# Patient Record
Sex: Male | Born: 1935 | Race: White | Hispanic: No | Marital: Married | Smoking: Former smoker
Health system: Southern US, Community
[De-identification: ages and names within clinical notes are randomized; demographics above are authoritative.]

## PROBLEM LIST (undated history)

## (undated) DIAGNOSIS — K219 Gastro-esophageal reflux disease without esophagitis: Secondary | ICD-10-CM

## (undated) DIAGNOSIS — K5792 Diverticulitis of intestine, part unspecified, without perforation or abscess without bleeding: Secondary | ICD-10-CM

## (undated) DIAGNOSIS — E785 Hyperlipidemia, unspecified: Secondary | ICD-10-CM

## (undated) DIAGNOSIS — J302 Other seasonal allergic rhinitis: Secondary | ICD-10-CM

## (undated) DIAGNOSIS — G5600 Carpal tunnel syndrome, unspecified upper limb: Secondary | ICD-10-CM

## (undated) DIAGNOSIS — J45909 Unspecified asthma, uncomplicated: Secondary | ICD-10-CM

## (undated) DIAGNOSIS — H9190 Unspecified hearing loss, unspecified ear: Secondary | ICD-10-CM

## (undated) DIAGNOSIS — I10 Essential (primary) hypertension: Secondary | ICD-10-CM

## (undated) HISTORY — PX: NOSE SURGERY: SHX723

## (undated) HISTORY — PX: CHOLECYSTECTOMY: SHX55

## (undated) HISTORY — PX: CARPAL TUNNEL RELEASE: SHX101

## (undated) HISTORY — PX: SHOULDER SURGERY: SHX246

---

## 2004-04-05 ENCOUNTER — Ambulatory Visit: Payer: Self-pay | Admitting: General Practice

## 2004-04-05 ENCOUNTER — Other Ambulatory Visit: Payer: Self-pay

## 2004-08-22 ENCOUNTER — Ambulatory Visit: Payer: Self-pay | Admitting: General Practice

## 2006-02-26 ENCOUNTER — Ambulatory Visit: Payer: Self-pay | Admitting: Ophthalmology

## 2007-11-05 ENCOUNTER — Emergency Department: Payer: Self-pay | Admitting: Emergency Medicine

## 2008-03-16 ENCOUNTER — Ambulatory Visit: Payer: Self-pay | Admitting: Unknown Physician Specialty

## 2009-11-19 ENCOUNTER — Ambulatory Visit: Payer: Self-pay | Admitting: Internal Medicine

## 2010-06-24 DIAGNOSIS — A0472 Enterocolitis due to Clostridium difficile, not specified as recurrent: Secondary | ICD-10-CM

## 2010-06-24 HISTORY — DX: Enterocolitis due to Clostridium difficile, not specified as recurrent: A04.72

## 2010-09-23 ENCOUNTER — Ambulatory Visit: Payer: Self-pay | Admitting: Internal Medicine

## 2010-09-24 ENCOUNTER — Ambulatory Visit: Payer: Self-pay | Admitting: Internal Medicine

## 2010-10-12 ENCOUNTER — Observation Stay: Payer: Self-pay | Admitting: Internal Medicine

## 2010-10-23 ENCOUNTER — Ambulatory Visit: Payer: Self-pay | Admitting: Internal Medicine

## 2011-01-23 ENCOUNTER — Ambulatory Visit: Payer: Self-pay | Admitting: Family Medicine

## 2011-01-26 ENCOUNTER — Inpatient Hospital Stay: Payer: Self-pay | Admitting: Student

## 2011-02-06 ENCOUNTER — Ambulatory Visit: Payer: Self-pay | Admitting: Family Medicine

## 2011-03-04 ENCOUNTER — Ambulatory Visit: Payer: Self-pay | Admitting: Family Medicine

## 2011-04-09 ENCOUNTER — Other Ambulatory Visit: Payer: Self-pay | Admitting: Gastroenterology

## 2011-04-30 ENCOUNTER — Other Ambulatory Visit: Payer: Self-pay | Admitting: Gastroenterology

## 2011-05-29 ENCOUNTER — Other Ambulatory Visit: Payer: Self-pay | Admitting: Gastroenterology

## 2011-05-30 ENCOUNTER — Ambulatory Visit: Payer: Self-pay | Admitting: Surgery

## 2011-06-13 ENCOUNTER — Other Ambulatory Visit: Payer: Self-pay | Admitting: Gastroenterology

## 2011-07-03 ENCOUNTER — Ambulatory Visit: Payer: Self-pay | Admitting: Surgery

## 2011-07-04 LAB — HEMOGLOBIN: HGB: 12 g/dL — ABNORMAL LOW (ref 13.0–18.0)

## 2011-07-05 LAB — PATHOLOGY REPORT

## 2011-07-10 ENCOUNTER — Other Ambulatory Visit: Payer: Self-pay | Admitting: Gastroenterology

## 2011-07-12 LAB — CLOSTRIDIUM DIFFICILE BY PCR

## 2013-05-19 ENCOUNTER — Ambulatory Visit: Payer: Self-pay | Admitting: Gastroenterology

## 2013-07-31 ENCOUNTER — Ambulatory Visit: Payer: Self-pay | Admitting: Family Medicine

## 2013-08-04 LAB — WOUND CULTURE

## 2013-12-23 DIAGNOSIS — K219 Gastro-esophageal reflux disease without esophagitis: Secondary | ICD-10-CM | POA: Diagnosis present

## 2014-10-16 NOTE — Op Note (Signed)
PATIENT NAME:  Colin, Decker MR#:  [id number] DATE OF BIRTH:  [birth year]  DATE OF PROCEDURE:  07/03/2011  PREOPERATIVE DIAGNOSIS: Chronic cholecystitis, cholelithiasis, with history of biliary pancreatitis.   POSTOPERATIVE DIAGNOSIS: Chronic cholecystitis, cholelithiasis, with history of biliary pancreatitis.   PROCEDURE: Laparoscopic cholecystectomy, cholangiogram.   SURGEON: Rochel Brome, MD   ASSISTANT: Britta Mccreedy, PA   ANESTHESIA: General.   INDICATIONS: This 81 year old male has a history of abdominal pain, findings of pancreatitis, ultrasound findings of sludge and a gallbladder ejection fraction of 18%. It appeared that he had biliary pancreatitis, and laparoscopic cholecystectomy was recommended to help avoid future cases of pain and pancreatitis.   DESCRIPTION OF PROCEDURE: The patient was placed on the operating table in the supine position under general endotracheal anesthesia. The abdomen was prepared with ChloraPrep and draped in a sterile manner.   A short incision was made in the inferior aspect of the umbilicus and carried down to the deep fascia, which was grasped with a laryngeal hook and elevated. A Veress needle was inserted and aspirated and irrigated with a saline solution. Next, the peritoneal cavity was inflated with carbon dioxide. The Veress needle was removed. The 10 mm cannula was inserted. The 10 mm, 0 degree laparoscope was inserted to view the peritoneal cavity. The liver appeared to be significantly  above the costal margin and was covered with omentum. Another incision was made in the epigastrium slightly to the right of the midline to introduce another 11 mm cannula. Two incisions were made in the lateral aspect of the right upper quadrant to introduce two 5 mm cannulas.   Graspers and microdissector were used to maneuver the omentum down off the liver. I then could see a portion of the gallbladder. The omentum was retracted. I switched to the 25 degree  laparoscope, and there was a large amount of fatty tissue and bowel which obscured the view of the gallbladder. The gallbladder was retracted towards the right shoulder. It was elected to make a fifth incision which is just slightly to the left of the midline in the epigastrium to introduce another 11 mm cannula. I brought the skull to this port and then inserted a five-finger fan retractor through the umbilical port and advanced this up to retract the omentum and transverse colon for better exposure of the gallbladder. The porta hepatis was demonstrated. The gallbladder had a slightly thickened wall. The infundibulum was retracted inferiorly and laterally. A number of adhesions were taken down with blunt and sharp dissection. Next, dissection was carried out to isolate the cystic artery from surrounding tissues; and this appeared to be overlying the site of the cystic duct, and the cystic artery was positively identified and was therefore divided by applying and two EndoClips proximally, one distally, and dividing the cystic artery with the scissors. Next, further dissection was carried out mobilizing the neck of the gallbladder and exposing the cystic duct, which was dissected free from surrounding structures. Its junction with the gallbladder was demonstrated and with the porta hepatis demonstrated. Next, an EndoClip was placed across the cystic duct adjacent to the neck of the gallbladder. An incision was made in the cystic duct, and I inserted a Reddick catheter. Half-strength Conray-60 dye was injected as the cholangiogram was done with fluoroscopy demonstrating the biliary tree and flow of dye into the duodenum. Bile ducts were small in size. No retained stones were seen. The Reddick catheter was removed. The cystic duct was doubly ligated with EndoClips and divided. Next,  the gallbladder was dissected free from the liver. It is noted that approximately two thirds of the way through this dissection there was  some bleeding from the gallbladder bed which was somewhat brisk, and this was aspirated; and it was about this time that we had to change carbon dioxide canisters, and there was some bleeding during the changing of the canisters. I aspirated additional blood and some clots and held pressure on this site with the stone scoop, and subsequently I was able to suction enough and see where several bleeding points could be cauterized. Next, the gallbladder was completely separated from the liver with additional use of hook and cautery, and was delivered out through the umbilical port site as it was opened and suctioned and removed, and I sent it with palpable stone for routine pathology. The 10 mm cannula was reinserted into the umbilical incision. The gallbladder bed was further inspected. There was some clot by this time which was aspirated, and also I aspirated some bloody fluid lateral to the liver. The gallbladder fossa was further irrigated and aspirated. Several additional small bleeding points were cauterized, and subsequently it appeared the hemostasis was intact. The gallbladder bed was subsequently treated with Surgiflo with thrombin. Following this, I did not identify any further bleeding. Next, a 19-French Blake drain was inserted and brought out through the lowermost 5 mm cannula site and was placed along the peritoneal reflection laterally and placed just to the right of the liver. This was secured to the skin with 3-0 nylon suture. Next, the cannulas were removed allowing carbon dioxide to escape from the peritoneal cavity. Skin incisions were closed with interrupted 5-0 chromic subcuticular sutures, benzoin, and Steri-Strips. Dressings were applied with paper tape. The drain was activated. There was just scant serosanguineous drainage. The patient appeared to be in satisfactory condition. Estimated blood loss was approximately 200 mL. The patient was then prepared for transfer to the recovery room.     ____________________________ Lenna Sciara. Rochel Brome, MD [id number] D: 07/03/2011 13:52:49 ET T: 07/03/2011 16:37:53 ET JOB#: [id number]  cc: Loreli Dollar, MD, <Dictator> Loreli Dollar MD ELECTRONICALLY SIGNED 07/28/2011 15:04

## 2015-10-31 ENCOUNTER — Ambulatory Visit
Admission: EM | Admit: 2015-10-31 | Discharge: 2015-10-31 | Disposition: A | Discharge status: Home / Self Care | Payer: Medicare Other | Attending: Family Medicine | Admitting: Family Medicine

## 2015-10-31 DIAGNOSIS — J069 Acute upper respiratory infection, unspecified: Secondary | ICD-10-CM | POA: Diagnosis not present

## 2015-10-31 HISTORY — DX: Essential (primary) hypertension: I10

## 2015-10-31 MED ORDER — HYDROCOD POLST-CPM POLST ER 10-8 MG/5ML PO SUER: 5.0000 mL | Freq: Two times a day (BID) | ORAL | Status: DC

## 2015-10-31 MED ORDER — AZITHROMYCIN 250 MG PO TABS: ORAL_TABLET | ORAL | Status: DC

## 2015-10-31 NOTE — Discharge Instructions (Signed)
Cool Mist Vaporizers °Vaporizers may help relieve the symptoms of a cough and cold. They add moisture to the air, which helps mucus to become thinner and less sticky. This makes it easier to breathe and cough up secretions. Cool mist vaporizers do not cause serious burns like hot mist vaporizers, which may also be called steamers or humidifiers. Vaporizers have not been proven to help with colds. You should not use a vaporizer if you are allergic to mold. °HOME CARE INSTRUCTIONS °· Follow the package instructions for the vaporizer. °· Do not use anything other than distilled water in the vaporizer. °· Do not run the vaporizer all of the time. This can cause mold or bacteria to grow in the vaporizer. °· Clean the vaporizer after each time it is used. °· Clean and dry the vaporizer well before storing it. °· Stop using the vaporizer if worsening respiratory symptoms develop. °  °This information is not intended to replace advice given to you by your health care provider. Make sure you discuss any questions you have with your health care provider. °  °Document Released: 03/07/2004 Document Revised: 06/15/2013 Document Reviewed: 10/28/2012 °Elsevier Interactive Patient Education ©2016 [employer] ° °Upper Respiratory Infection, Adult °Most upper respiratory infections (URIs) are a viral infection of the air passages leading to the lungs. A URI affects the nose, throat, and upper air passages. The most common type of URI is nasopharyngitis and is typically referred to as "the common cold." °URIs run their course and usually go away on their own. Most of the time, a URI does not require medical attention, but sometimes a bacterial infection in the upper airways can follow a viral infection. This is called a secondary infection. Sinus and middle ear infections are common types of secondary upper respiratory infections. °Bacterial pneumonia can also complicate a URI. A URI can worsen asthma and chronic obstructive  pulmonary disease (COPD). Sometimes, these complications can require emergency medical care and may be life threatening.  °CAUSES °Almost all URIs are caused by viruses. A virus is a type of germ and can spread from one person to another.  °RISKS FACTORS °You may be at risk for a URI if:  °· You smoke.   °· You have chronic heart or lung disease. °· You have a weakened defense (immune) system.   °· You are very young or very old.   °· You have nasal allergies or asthma. °· You work in crowded or poorly ventilated areas. °· You work in health care facilities or schools. °SIGNS AND SYMPTOMS  °Symptoms typically develop 2-3 days after you come in contact with a cold virus. Most viral URIs last 7-10 days. However, viral URIs from the influenza virus (flu virus) can last 14-18 days and are typically more severe. Symptoms may include:  °· Runny or stuffy (congested) nose.   °· Sneezing.   °· Cough.   °· Sore throat.   °· Headache.   °· Fatigue.   °· Fever.   °· Loss of appetite.   °· Pain in your forehead, behind your eyes, and over your cheekbones (sinus pain). °· Muscle aches.   °DIAGNOSIS  °Your health care provider may diagnose a URI by: °· Physical exam. °· Tests to check that your symptoms are not due to another condition such as: °¨ Strep throat. °¨ Sinusitis. °¨ Pneumonia. °¨ Asthma. °TREATMENT  °A URI goes away on its own with time. It cannot be cured with medicines, but medicines may be prescribed or recommended to relieve symptoms. Medicines may help: °· Reduce your fever. °· Reduce   your cough. °· Relieve nasal congestion. °HOME CARE INSTRUCTIONS  °· Take medicines only as directed by your health care provider.   °· Gargle warm saltwater or take cough drops to comfort your throat as directed by your health care provider. °· Use a warm mist humidifier or inhale steam from a shower to increase air moisture. This may make it easier to breathe. °· Drink enough fluid to keep your urine clear or pale yellow.   °· Eat  soups and other clear broths and maintain good nutrition.   °· Rest as needed.   °· Return to work when your temperature has returned to normal or as your health care provider advises. You may need to stay home longer to avoid infecting others. You can also use a face mask and careful hand washing to prevent spread of the virus. °· Increase the usage of your inhaler if you have asthma.   °· Do not use any tobacco products, including cigarettes, chewing tobacco, or electronic cigarettes. If you need help quitting, ask your health care provider. °PREVENTION  °The best way to protect yourself from getting a cold is to practice good hygiene.  °· Avoid oral or hand contact with people with cold symptoms.   °· Wash your hands often if contact occurs.   °There is no clear evidence that vitamin C, vitamin E, echinacea, or exercise reduces the chance of developing a cold. However, it is always recommended to get plenty of rest, exercise, and practice good nutrition.  °SEEK MEDICAL CARE IF:  °· You are getting worse rather than better.   °· Your symptoms are not controlled by medicine.   °· You have chills. °· You have worsening shortness of breath. °· You have brown or red mucus. °· You have yellow or brown nasal discharge. °· You have pain in your face, especially when you bend forward. °· You have a fever. °· You have swollen neck glands. °· You have pain while swallowing. °· You have white areas in the back of your throat. °SEEK IMMEDIATE MEDICAL CARE IF:  °· You have severe or persistent: °¨ Headache. °¨ Ear pain. °¨ Sinus pain. °¨ Chest pain. °· You have chronic lung disease and any of the following: °¨ Wheezing. °¨ Prolonged cough. °¨ Coughing up blood. °¨ A change in your usual mucus. °· You have a stiff neck. °· You have changes in your: °¨ Vision. °¨ Hearing. °¨ Thinking. °¨ Mood. °MAKE SURE YOU:  °· Understand these instructions. °· Will watch your condition. °· Will get help right away if you are not doing well or  get worse. °  °This information is not intended to replace advice given to you by your health care provider. Make sure you discuss any questions you have with your health care provider. °  °Document Released: 12/04/2000 Document Revised: 10/25/2014 Document Reviewed: 09/15/2013 °Elsevier Interactive Patient Education ©2016 Elsevier Inc. ° °

## 2015-10-31 NOTE — ED Provider Notes (Signed)
CSN: [id number]     Arrival date & time 10/31/15  1121 History   First MD Initiated Contact with Patient 10/31/15 1227     Chief Complaint  Patient presents with  . URI    Reports cough of yellow phlegm and "URI" sx starting on Saturday. Wife was seen here on Friday for similar sx and given Z pack.    (Consider location/radiation/quality/duration/timing/severity/associated sxs/prior Treatment) HPI  This is a 82 year old male who presents with cough productive of yellow sputum which started on Saturday. His wife was here on Friday for the same symptoms and was prescribed a Z-Pak which has almost totally resolved her problems. He did try half teaspoon of the cough syrup last night which lasted about 2 hours and then he started coughing more. Vital signs show temperature 98.3, pulse 74, respirations 18, blood pressure 132/67, and O2 sats 100% on room air    Past Medical History  Diagnosis Date  . Hypertension    History reviewed. No pertinent past surgical history. History reviewed. No pertinent family history. Social History  Substance Use Topics  . Smoking status: Former Research scientist (life sciences)  . Smokeless tobacco: None  . Alcohol Use: No    Review of Systems  Constitutional: Positive for activity change. Negative for fever, chills and fatigue.  HENT: Positive for congestion and postnasal drip.   Respiratory: Positive for cough. Negative for shortness of breath.   All other systems reviewed and are negative.   Allergies  Biaxin; Codeine sulfate; Erythromycin; Naprosyn; and Augmentin  Home Medications   Prior to Admission medications   Medication Sig Start Date End Date Taking? Authorizing Provider  amLODipine (NORVASC) 2.5 MG tablet Take 2.5 mg by mouth daily.   Yes Historical Provider, MD  aspirin 81 MG tablet Take 81 mg by mouth daily.   Yes Historical Provider, MD  calcium carbonate (OS-CAL - DOSED IN MG OF ELEMENTAL CALCIUM) 1250 (500 Ca) MG tablet Take 0.5 tablets by mouth.   Yes  Historical Provider, MD  RABEprazole (ACIPHEX) 20 MG tablet Take 20 mg by mouth daily.   Yes Historical Provider, MD  azithromycin (ZITHROMAX Z-PAK) 250 MG tablet Use as per package instructions 10/31/15   Lorin Picket, PA-C  chlorpheniramine-HYDROcodone [facility] ER) 10-8 MG/5ML SUER Take 5 mLs by mouth 2 (two) times daily. 10/31/15   Lorin Picket, PA-C  lisinopril (PRINIVIL,ZESTRIL) 20 MG tablet Take 20 mg by mouth daily.    Historical Provider, MD   Meds Ordered and Administered this Visit  Medications - No data to display  BP 132/67 mmHg  Pulse 74  Temp(Src) 98.3 F (36.8 C)  Resp 18  Ht 5\' 1"  (1.549 m)  Wt 129 lb (58.514 kg)  BMI 24.39 kg/m2  SpO2 100% No data found.   Physical Exam  Constitutional: He is oriented to person, place, and time. He appears well-developed and well-nourished. No distress.  HENT:  Head: Normocephalic and atraumatic.  Right Ear: External ear normal.  Left Ear: External ear normal.  Nose: Nose normal.  Mouth/Throat: Oropharynx is clear and moist. No oropharyngeal exudate.  Patient wears bilateral hearing aids  Eyes: Conjunctivae are normal. Pupils are equal, round, and reactive to light. Right eye exhibits no discharge. Left eye exhibits no discharge.  Neck: Normal range of motion. Neck supple.  Pulmonary/Chest: Effort normal and breath sounds normal. No respiratory distress. He has no wheezes. He has no rales.  Musculoskeletal: Normal range of motion. He exhibits no edema or tenderness.  Lymphadenopathy:  He has no cervical adenopathy.  Neurological: He is alert and oriented to person, place, and time.  Skin: Skin is warm and dry. He is not diaphoretic.  Psychiatric: He has a normal mood and affect. His behavior is normal. Judgment and thought content normal.  Nursing note and vitals reviewed.   ED Course  Procedures (including critical care time)  Labs Review Labs Reviewed - No data to display  Imaging Review No  results found.   Visual Acuity Review  Right Eye Distance:   Left Eye Distance:   Bilateral Distance:    Right Eye Near:   Left Eye Near:    Bilateral Near:         MDM   1. Acute URI    Discharge Medication List as of 10/31/2015 12:49 PM    START taking these medications   Details  azithromycin (ZITHROMAX Z-PAK) 250 MG tablet Use as per package instructions, Normal    chlorpheniramine-HYDROcodone (TUSSIONEX PENNKINETIC ER) 10-8 MG/5ML SUER Take 5 mLs by mouth 2 (two) times daily., Starting 10/31/2015, Until Discontinued, Print      Plan: 1. Test/x-ray results and diagnosis reviewed with patient 2. rx as per orders; risks, benefits, potential side effects reviewed with patient 3. Recommend supportive treatment withFluids and rest. It is not improving within several days he should be seen by his primary care physician possible chest x-ray.She does have a listed allergy to erythromycin and clarithromycin but is listed only as nausea. I have asked him to take the medication with food to see if this will help. Is allergic to several antibiotics and I feel that this will provide the best coverage for him. 4. F/u prn if symptoms worsen or don't improve    Lorin Picket, PA-C 10/31/15 1309

## 2015-12-18 DIAGNOSIS — N1831 Chronic kidney disease, stage 3a: Secondary | ICD-10-CM | POA: Diagnosis present

## 2017-04-21 ENCOUNTER — Ambulatory Visit
Admission: EM | Admit: 2017-04-21 | Discharge: 2017-04-21 | Disposition: A | Discharge status: Home / Self Care | Payer: Medicare Other | Attending: Family Medicine | Admitting: Family Medicine

## 2017-04-21 ENCOUNTER — Encounter: Payer: Self-pay | Admitting: *Deleted

## 2017-04-21 DIAGNOSIS — H9202 Otalgia, left ear: Secondary | ICD-10-CM | POA: Diagnosis not present

## 2017-04-21 DIAGNOSIS — L03818 Cellulitis of other sites: Secondary | ICD-10-CM

## 2017-04-21 DIAGNOSIS — H6010 Cellulitis of external ear, unspecified ear: Secondary | ICD-10-CM

## 2017-04-21 HISTORY — DX: Unspecified asthma, uncomplicated: J45.909

## 2017-04-21 HISTORY — DX: Hyperlipidemia, unspecified: E78.5

## 2017-04-21 HISTORY — DX: Gastro-esophageal reflux disease without esophagitis: K21.9

## 2017-04-21 MED ORDER — DOXYCYCLINE HYCLATE 100 MG PO CAPS: 100.0000 mg | ORAL_CAPSULE | Freq: Two times a day (BID) | ORAL | 0 refills | Status: DC

## 2017-04-21 NOTE — ED Triage Notes (Signed)
Patient started having symptom of swollen left ear yesterday. The ear is visibly swollen. Patient had a left ear infection 40 years ago and was hospitalized.

## 2017-04-21 NOTE — Discharge Instructions (Signed)
Antibiotic as prescribed.  Do not take it with your other medications.  Take care  Dr. Lacinda Axon

## 2017-04-21 NOTE — ED Provider Notes (Signed)
[facility]    CSN: [id number] Arrival date & time: 04/21/17  1504  History   Chief Complaint Chief Complaint  Patient presents with  . Facial Swelling   HPI  83 year old male presents with swelling and redness of the left ear.  Started last night.  No associated fever.  He reports some associated pain as well as redness and swelling of the left ear.  No known inciting factor other than potentially scratch.  He states that his ear itches often.  He feels that this is due to his hearing aids.  No known exacerbating or relieving factors.  No medications or interventions tried.  He does note that 40 years ago he had a case of erysipelas which presented similarly.  No other complaints or concerns at this time.  Past Medical History:  Diagnosis Date  . Asthma   . GERD (gastroesophageal reflux disease)   . Hyperlipidemia   . Hypertension    Past Surgical History:  Procedure Laterality Date  . SHOULDER SURGERY Left     Home Medications    Prior to Admission medications   Medication Sig Start Date End Date Taking? Authorizing Provider  amLODipine (NORVASC) 2.5 MG tablet Take 2.5 mg by mouth daily.   Yes [provider]  aspirin 81 MG tablet Take 81 mg by mouth daily.   Yes [provider]  calcium carbonate (OS-CAL - DOSED IN MG OF ELEMENTAL CALCIUM) 1250 (500 Ca) MG tablet Take 0.5 tablets by mouth.   Yes [provider]  chlorthalidone (HYGROTON) 25 MG tablet Take 25 mg by mouth daily.   Yes [provider]  febuxostat (ULORIC) 40 MG tablet Take 40 mg by mouth daily.   Yes [provider]  lisinopril (PRINIVIL,ZESTRIL) 20 MG tablet Take 20 mg by mouth daily.   Yes [provider]  RABEprazole (ACIPHEX) 20 MG tablet Take 20 mg by mouth daily.   Yes [provider]  azithromycin (ZITHROMAX Z-PAK) 250 MG tablet Use as per package instructions 10/31/15   Lorin Picket, PA-C  chlorpheniramine-HYDROcodone  [facility] ER) 10-8 MG/5ML SUER Take 5 mLs by mouth 2 (two) times daily. 10/31/15   Lorin Picket, PA-C  doxycycline (VIBRAMYCIN) 100 MG capsule Take 1 capsule (100 mg total) by mouth 2 (two) times daily. 04/21/17   Jasilyn Holderman, Barnie Del, DO  Ipratropium-Albuterol (COMBIVENT RESPIMAT) 20-100 MCG/ACT AERS respimat Inhale 1 puff into the lungs every 6 (six) hours.    [provider]   Family History COPD Father    Rheum arthritis Mother    Allergies Other  Sibling  Myocardial Infarction (Heart attack) Other  Sibling  Heart disease Sister     Social History Social History  Substance Use Topics  . Smoking status: Former Research scientist (life sciences)  . Smokeless tobacco: Never Used  . Alcohol use No   Allergies   Biaxin [clarithromycin]; Codeine sulfate; Erythromycin; Naprosyn [naproxen]; and Augmentin [amoxicillin-pot clavulanate]  Review of Systems Review of Systems  Constitutional: Negative for fever.  HENT:       Left ear pain, swelling, redness.   Physical Exam Triage Vital Signs ED Triage Vitals  Enc Vitals Group     BP 04/21/17 1537 (!) 136/57     Pulse Rate 04/21/17 1537 63     Resp 04/21/17 1537 16     Temp 04/21/17 1537 98 F (36.7 C)     Temp Source 04/21/17 1537 Oral     SpO2 04/21/17 1537 100 %  Weight 04/21/17 1539 130 lb (59 kg)     Height 04/21/17 1539 5\' 1"  (1.549 m)     Head Circumference --      Peak Flow --      Pain Score 04/21/17 1540 0     Pain Loc --      Pain Edu? --      Excl. in [place]? --    Updated Vital Signs BP (!) 136/57 (BP Location: Left Arm)   Pulse 63   Temp 98 F (36.7 C) (Oral)   Resp 16   Ht 5\' 1"  (1.549 m)   Wt 130 lb (59 kg)   SpO2 100%   BMI 24.56 kg/m   Physical Exam  Constitutional: He is oriented to person, place, and time. He appears well-developed. No distress.  HENT:  Head: Normocephalic and atraumatic.  Left ear -external ear is red, swollen, and warm to the touch.  Eyes: Conjunctivae are normal. No scleral  icterus.  Cardiovascular: Normal rate and regular rhythm.   Pulmonary/Chest: Effort normal and breath sounds normal. He has no wheezes. He has no rales.  Neurological: He is alert and oriented to person, place, and time.  Skin:  Left ear erythema, swelling.   Psychiatric: He has a normal mood and affect.  Vitals reviewed.  UC Treatments / Results  Labs (all labs ordered are listed, but only abnormal results are displayed) Labs Reviewed - No data to display  EKG  EKG Interpretation None       Radiology No results found.  Procedures Procedures (including critical care time)  Medications Ordered in UC Medications - No data to display   Initial Impression / Assessment and Plan / UC Course  I have reviewed the triage vital signs and the nursing notes.  Pertinent labs & imaging results that were available during my care of the patient were reviewed by me and considered in my medical decision making (see chart for details).    83 year old male presents with left ear swelling, erythema, warmth.  Consistent with cellulitis. Treating with Doxy.  Final Clinical Impressions(s) / UC Diagnoses   Final diagnoses:  Cellulitis of other specified site   New Prescriptions Discharge Medication List as of 04/21/2017  4:54 PM    START taking these medications   Details  doxycycline (VIBRAMYCIN) 100 MG capsule Take 1 capsule (100 mg total) by mouth 2 (two) times daily., Starting Mon 04/21/2017, Normal       Controlled Substance Prescriptions [place] Controlled Substance Registry consulted? Not Applicable   Coral Spikes, DO 04/21/17 1709

## 2018-12-21 ENCOUNTER — Ambulatory Visit
Admission: EM | Admit: 2018-12-21 | Discharge: 2018-12-21 | Disposition: A | Discharge status: Home / Self Care | Payer: Medicare Other

## 2018-12-21 ENCOUNTER — Other Ambulatory Visit: Payer: Self-pay

## 2018-12-21 DIAGNOSIS — H1032 Unspecified acute conjunctivitis, left eye: Secondary | ICD-10-CM | POA: Diagnosis not present

## 2018-12-21 MED ORDER — ERYTHROMYCIN 5 MG/GM OP OINT: TOPICAL_OINTMENT | OPHTHALMIC | 0 refills | Status: DC

## 2018-12-21 NOTE — ED Provider Notes (Signed)
Name: NARVEL KOZUB DOB: April 16, 1928 MRN: [id number] CSN: [id number] PCP: Sofie Hartigan, MD  Arrival date and time:  12/21/18 0813  Chief Complaint:  Eye Problem (left)  NOTE: Prior to seeing the patient today, I have reviewed the triage nursing documentation and vital signs. Clinical staff has updated patient's PMH/PSHx, current medication list, and drug allergies/intolerances to ensure comprehensive history available to assist in medical decision making.   History:   HPI: JASPER HANF is a 85 y.o. male who presents today with complaints of redness and swelling to his LEFT eye that began on Friday (12/18/2018). Patient denies pain or pruritis. Patient has observed a small "yellow area" to the inside of his lower eyelid. He notes some minor purulent drainage that is yellow in color. No fevers. He denies excessive tearing. Vision is not blurred. Patient notes that eye irritation is not exacerbated by light (photophobia). He wears glasses. Patient has not tried any over the counter eye drops or pain medications to help reduce/relieve his current symptoms.   Visual Acuity: Right Eye Distance: 20/50 Left Eye Distance: 20/30 Bilateral Distance: 20/30  Past Medical History:  Diagnosis Date   Asthma    GERD (gastroesophageal reflux disease)    Hyperlipidemia    Hypertension     Past Surgical History:  Procedure Laterality Date   SHOULDER SURGERY Left     History reviewed. No pertinent family history.  There are no active problems to display for this patient.   Home Medications:    Current Meds  Medication Sig   allopurinol (ZYLOPRIM) 100 MG tablet    amLODipine (NORVASC) 10 MG tablet Take 20 mg by mouth daily.    aspirin 81 MG tablet Take 81 mg by mouth daily.   famotidine (PEPCID) 20 MG tablet Take by mouth.   Fluticasone-Salmeterol (ADVAIR) 250-50 MCG/DOSE AEPB Inhale into the lungs.   Ipratropium-Albuterol (COMBIVENT RESPIMAT) 20-100 MCG/ACT  AERS respimat Inhale 1 puff into the lungs every 6 (six) hours.   Multiple Vitamin (MULTI-VITAMIN) tablet Take by mouth.    Allergies:   Biaxin [clarithromycin], Codeine sulfate, Erythromycin, Naprosyn [naproxen], and Augmentin [amoxicillin-pot clavulanate]  Review of Systems (ROS): Review of Systems  Constitutional: Negative for chills and fever.  HENT: Negative for congestion, ear pain, facial swelling, rhinorrhea, sinus pressure, sinus pain, sneezing and sore throat.   Eyes: Positive for discharge and redness. Negative for photophobia, pain, itching and visual disturbance.  Respiratory: Negative for cough and shortness of breath.   Cardiovascular: Negative for chest pain and palpitations.     Triage Vital Signs: ED Triage Vitals  Enc Vitals Group     BP 12/21/18 0825 (!) 170/64     Pulse Rate 12/21/18 0825 69     Resp 12/21/18 0825 16     Temp 12/21/18 0825 97.8 F (36.6 C)     Temp Source 12/21/18 0825 Oral     SpO2 12/21/18 0825 100 %     Weight 12/21/18 0821 133 lb 6.4 oz (60.5 kg)     Height --      Head Circumference --      Peak Flow --      Pain Score 12/21/18 0821 0     Pain Loc --      Pain Edu? --      Excl. in [place]? --     Physical Exam: Physical Exam  Constitutional: He is oriented to person, place, and time and well-developed, well-nourished, and in no distress.  HENT:  Head: Normocephalic and atraumatic.  Mouth/Throat: Mucous membranes are normal.  Eyes: Pupils are equal, round, and reactive to light. EOM are normal. Right eye exhibits no exudate. Left eye exhibits exudate (yellow purulent "at times"; no excessive tearing). Right conjunctiva is not injected. Left conjunctiva is injected.  Small yellow raised area to medial aspect of LEFT lower eye lid; non-tender. (+) slight swelling and redness under eye extending from the inner canthus medially; non-tender. Vision at baseline.   Neck: Normal range of motion. Neck supple. No tracheal deviation present.    Cardiovascular: Normal rate, regular rhythm, normal heart sounds and intact distal pulses. Exam reveals no gallop and no friction rub.  Pulmonary/Chest: Effort normal and breath sounds normal. No respiratory distress. He has no wheezes. He has no rales.  Lymphadenopathy:    He has no cervical adenopathy.  Neurological: He is alert and oriented to person, place, and time.  Skin: Skin is warm and dry. No rash noted. No erythema.  Psychiatric: Mood, memory, affect and judgment normal.  Nursing note and vitals reviewed.  Urgent Care Treatments / Results:   LABS: PLEASE NOTE: all labs that were ordered this encounter are listed, however only abnormal results are displayed. Labs Reviewed - No data to display  EKG: -None  RADIOLOGY: No results found.  PROCEDURES: Procedures  MEDICATIONS RECEIVED THIS VISIT: Medications - No data to display  PERTINENT CLINICAL COURSE NOTES/UPDATES:   Initial Impression / Assessment and Plan / Urgent Care Course:    HELMER DULL is a 85 y.o. male who presents to [facility] today with complaints of Eye Problem (left)   Pertinent labs & imaging results that were available during my care of the patient were personally reviewed by me and considered in my medical decision making (see lab/imaging section of note for values and interpretations).  Patient overall well appearing and in no acute distress today in clinic. Exam reveals slight redness and and infraorbital swelling on the LEFT. Conjunctiva is injected with (+) scleral erythema. Lower eyelid with small yellow growth noted to inner aspect of lid. Patient unable to advise as to whether growth is acute. No pain or pruritis. He notes some intermittent purulent drainage "from time to time". No injury to the eye. Vision is at baseline; wears glasses. Suspect bacterial conjunctivitis. Due to age and dexterity, anticipate issues with ophthalmic gtt use. In efforts to promote proper treatment, and  to increase conjunctival contact, will prescribe ERY ophthalmic ointment. Patient advised to use cool compresses to help sooth his eye.    Patient needs to be seen for further evaluation by ophthalmology to assess yellow growth to lower eyelid. He has normally seen by Dr. Wallace Going at [facility]. Patient advised the he will need to contact the office to schedule an appointment to be seen.   I have reviewed the follow up and strict return precautions for any new or worsening symptoms. Patient is aware of symptoms that would be deemed urgent/emergent, and would thus require further evaluation either here or in the emergency department. At the time of discharge, he verbalized understanding and consent with the discharge plan as it was reviewed with him. All questions were fielded by provider and/or clinic staff prior to patient discharge.    Final Clinical Impressions(s) / Urgent Care Diagnoses:   Final diagnoses:  Acute bacterial conjunctivitis of left eye    New Prescriptions:   Meds ordered this encounter  Medications   erythromycin ophthalmic ointment    Sig:  Place a 1/2 inch ribbon of ointment into the lower eyelid daily x 5 days x 1 week.    Dispense:  1 g    Refill:  0    Controlled Substance Prescriptions:   Controlled Substance Registry consulted? Not Applicable  NOTE: This note was prepared using Dragon dictation software along with smaller phrase technology. Despite my best ability to proofread, there is the potential that transcriptional errors may still occur from this process, and are completely unintentional.    Karen Kitchens, NP 12/21/18 1013

## 2018-12-21 NOTE — Discharge Instructions (Addendum)
It was very nice seeing you today in clinic. Thank you for entrusting me with your care.   Please utilize the medications that we discussed. Your prescriptions have been called in to your pharmacy.   Make arrangements to follow up with your regular eye doctor in the next couple of days for re-evaluation  If your symptoms/condition worsens, please seek follow up care either here or in the ER. Please remember, our [place] providers are "right here with you" when you need [place].   Again, it was my pleasure to take care of you today. Thank you for choosing our clinic. I hope that you start to feel better quickly.   Honor Loh, MSN, APRN, FNP-C, CEN Advanced Practice Provider [facility]

## 2018-12-21 NOTE — ED Triage Notes (Signed)
Patient complains of left eye swelling underneath with some redness that he noticed on Saturday.

## 2019-11-18 ENCOUNTER — Other Ambulatory Visit: Payer: Self-pay | Admitting: Student

## 2019-11-18 DIAGNOSIS — R935 Abnormal findings on diagnostic imaging of other abdominal regions, including retroperitoneum: Secondary | ICD-10-CM

## 2019-11-18 DIAGNOSIS — R194 Change in bowel habit: Secondary | ICD-10-CM

## 2019-11-18 DIAGNOSIS — R195 Other fecal abnormalities: Secondary | ICD-10-CM

## 2019-11-30 ENCOUNTER — Ambulatory Visit
Admission: RE | Admit: 2019-11-30 | Discharge: 2019-11-30 | Disposition: A | Discharge status: Home / Self Care | Payer: Medicare Other | Source: Ambulatory Visit | Attending: Student | Admitting: Student

## 2019-11-30 ENCOUNTER — Other Ambulatory Visit: Payer: Self-pay

## 2019-11-30 DIAGNOSIS — R935 Abnormal findings on diagnostic imaging of other abdominal regions, including retroperitoneum: Secondary | ICD-10-CM

## 2019-11-30 DIAGNOSIS — R194 Change in bowel habit: Secondary | ICD-10-CM | POA: Insufficient documentation

## 2019-11-30 DIAGNOSIS — R195 Other fecal abnormalities: Secondary | ICD-10-CM | POA: Insufficient documentation

## 2019-11-30 DIAGNOSIS — E871 Hypo-osmolality and hyponatremia: Secondary | ICD-10-CM | POA: Diagnosis not present

## 2019-11-30 LAB — POCT I-STAT CREATININE: Creatinine, Ser: 1.4 mg/dL — ABNORMAL HIGH (ref 0.61–1.24)

## 2019-11-30 MED ORDER — IOHEXOL 300 MG/ML  SOLN
75.0000 mL | Freq: Once | INTRAMUSCULAR | Status: AC | PRN
  Administered 2019-11-30: 75 mL via INTRAVENOUS

## 2019-12-01 ENCOUNTER — Other Ambulatory Visit: Payer: Self-pay

## 2019-12-01 ENCOUNTER — Inpatient Hospital Stay
Admission: EM | Admit: 2019-12-01 | Discharge: 2019-12-05 | DRG: 641 | Disposition: A | Discharge status: Home Health | Payer: Medicare Other | Attending: Internal Medicine | Admitting: Internal Medicine

## 2019-12-01 ENCOUNTER — Inpatient Hospital Stay: Payer: Medicare Other | Admitting: Oncology

## 2019-12-01 ENCOUNTER — Inpatient Hospital Stay: Payer: Medicare Other

## 2019-12-01 DIAGNOSIS — I129 Hypertensive chronic kidney disease with stage 1 through stage 4 chronic kidney disease, or unspecified chronic kidney disease: Secondary | ICD-10-CM | POA: Diagnosis present

## 2019-12-01 DIAGNOSIS — Z20822 Contact with and (suspected) exposure to covid-19: Secondary | ICD-10-CM | POA: Diagnosis present

## 2019-12-01 DIAGNOSIS — D631 Anemia in chronic kidney disease: Secondary | ICD-10-CM | POA: Diagnosis present

## 2019-12-01 DIAGNOSIS — Z7951 Long term (current) use of inhaled steroids: Secondary | ICD-10-CM

## 2019-12-01 DIAGNOSIS — E785 Hyperlipidemia, unspecified: Secondary | ICD-10-CM | POA: Diagnosis present

## 2019-12-01 DIAGNOSIS — D696 Thrombocytopenia, unspecified: Secondary | ICD-10-CM | POA: Diagnosis present

## 2019-12-01 DIAGNOSIS — K219 Gastro-esophageal reflux disease without esophagitis: Secondary | ICD-10-CM | POA: Diagnosis present

## 2019-12-01 DIAGNOSIS — E860 Dehydration: Secondary | ICD-10-CM | POA: Diagnosis not present

## 2019-12-01 DIAGNOSIS — J449 Chronic obstructive pulmonary disease, unspecified: Secondary | ICD-10-CM | POA: Diagnosis present

## 2019-12-01 DIAGNOSIS — K5730 Diverticulosis of large intestine without perforation or abscess without bleeding: Secondary | ICD-10-CM | POA: Diagnosis present

## 2019-12-01 DIAGNOSIS — E861 Hypovolemia: Secondary | ICD-10-CM | POA: Diagnosis present

## 2019-12-01 DIAGNOSIS — K5900 Constipation, unspecified: Secondary | ICD-10-CM | POA: Diagnosis present

## 2019-12-01 DIAGNOSIS — I10 Essential (primary) hypertension: Secondary | ICD-10-CM | POA: Diagnosis present

## 2019-12-01 DIAGNOSIS — Z880 Allergy status to penicillin: Secondary | ICD-10-CM

## 2019-12-01 DIAGNOSIS — Z881 Allergy status to other antibiotic agents status: Secondary | ICD-10-CM

## 2019-12-01 DIAGNOSIS — E878 Other disorders of electrolyte and fluid balance, not elsewhere classified: Secondary | ICD-10-CM | POA: Diagnosis present

## 2019-12-01 DIAGNOSIS — Z886 Allergy status to analgesic agent status: Secondary | ICD-10-CM

## 2019-12-01 DIAGNOSIS — N182 Chronic kidney disease, stage 2 (mild): Secondary | ICD-10-CM | POA: Diagnosis present

## 2019-12-01 DIAGNOSIS — R112 Nausea with vomiting, unspecified: Secondary | ICD-10-CM | POA: Diagnosis not present

## 2019-12-01 DIAGNOSIS — E871 Hypo-osmolality and hyponatremia: Secondary | ICD-10-CM | POA: Diagnosis not present

## 2019-12-01 DIAGNOSIS — Z79899 Other long term (current) drug therapy: Secondary | ICD-10-CM

## 2019-12-01 DIAGNOSIS — R2681 Unsteadiness on feet: Secondary | ICD-10-CM | POA: Diagnosis present

## 2019-12-01 DIAGNOSIS — Z7982 Long term (current) use of aspirin: Secondary | ICD-10-CM

## 2019-12-01 DIAGNOSIS — Z87891 Personal history of nicotine dependence: Secondary | ICD-10-CM

## 2019-12-01 DIAGNOSIS — Z9049 Acquired absence of other specified parts of digestive tract: Secondary | ICD-10-CM

## 2019-12-01 DIAGNOSIS — M109 Gout, unspecified: Secondary | ICD-10-CM | POA: Diagnosis present

## 2019-12-01 DIAGNOSIS — J4489 Other specified chronic obstructive pulmonary disease: Secondary | ICD-10-CM | POA: Diagnosis present

## 2019-12-01 DIAGNOSIS — Z885 Allergy status to narcotic agent status: Secondary | ICD-10-CM

## 2019-12-01 LAB — SARS CORONAVIRUS 2 BY RT PCR (HOSPITAL ORDER, PERFORMED IN ~~LOC~~ HOSPITAL LAB): SARS Coronavirus 2: NEGATIVE

## 2019-12-01 LAB — CBC WITH DIFFERENTIAL/PLATELET
Abs Immature Granulocytes: 0.04 10*3/uL (ref 0.00–0.07)
Basophils Absolute: 0 10*3/uL (ref 0.0–0.1)
Basophils Relative: 0 %
Eosinophils Absolute: 0.2 10*3/uL (ref 0.0–0.5)
Eosinophils Relative: 3 %
HCT: 36.1 % — ABNORMAL LOW (ref 39.0–52.0)
Hemoglobin: 12.3 g/dL — ABNORMAL LOW (ref 13.0–17.0)
Immature Granulocytes: 1 %
Lymphocytes Relative: 15 %
Lymphs Abs: 1.1 10*3/uL (ref 0.7–4.0)
MCH: 33.7 pg (ref 26.0–34.0)
MCHC: 34.1 g/dL (ref 30.0–36.0)
MCV: 98.9 fL (ref 80.0–100.0)
Monocytes Absolute: 0.6 10*3/uL (ref 0.1–1.0)
Monocytes Relative: 8 %
Neutro Abs: 5.5 10*3/uL (ref 1.7–7.7)
Neutrophils Relative %: 73 %
Platelets: 107 10*3/uL — ABNORMAL LOW (ref 150–400)
RBC: 3.65 MIL/uL — ABNORMAL LOW (ref 4.22–5.81)
RDW: 12 % (ref 11.5–15.5)
WBC: 7.5 10*3/uL (ref 4.0–10.5)
nRBC: 0 % (ref 0.0–0.2)

## 2019-12-01 LAB — BASIC METABOLIC PANEL
Anion gap: 6 (ref 5–15)
BUN: 19 mg/dL (ref 8–23)
CO2: 24 mmol/L (ref 22–32)
Calcium: 7.7 mg/dL — ABNORMAL LOW (ref 8.9–10.3)
Chloride: 97 mmol/L — ABNORMAL LOW (ref 98–111)
Creatinine, Ser: 1.02 mg/dL (ref 0.61–1.24)
GFR calc Af Amer: >60 mL/min (ref >60)
GFR calc non Af Amer: >60 mL/min (ref >60)
Glucose, Bld: 95 mg/dL (ref 70–99)
Potassium: 4.4 mmol/L (ref 3.5–5.1)
Sodium: 127 mmol/L — ABNORMAL LOW (ref 135–145)

## 2019-12-01 LAB — COMPREHENSIVE METABOLIC PANEL
ALT: 22 U/L (ref 0–44)
AST: 30 U/L (ref 15–41)
Albumin: 4 g/dL (ref 3.5–5.0)
Alkaline Phosphatase: 60 U/L (ref 38–126)
Anion gap: 10 (ref 5–15)
BUN: 20 mg/dL (ref 8–23)
CO2: 25 mmol/L (ref 22–32)
Calcium: 8.4 mg/dL — ABNORMAL LOW (ref 8.9–10.3)
Chloride: 92 mmol/L — ABNORMAL LOW (ref 98–111)
Creatinine, Ser: 1.11 mg/dL (ref 0.61–1.24)
GFR calc Af Amer: >60 mL/min (ref >60)
GFR calc non Af Amer: 59 mL/min — ABNORMAL LOW (ref >60)
Glucose, Bld: 116 mg/dL — ABNORMAL HIGH (ref 70–99)
Potassium: 4 mmol/L (ref 3.5–5.1)
Sodium: 127 mmol/L — ABNORMAL LOW (ref 135–145)
Total Bilirubin: 1.7 mg/dL — ABNORMAL HIGH (ref 0.3–1.2)
Total Protein: 6.2 g/dL — ABNORMAL LOW (ref 6.5–8.1)

## 2019-12-01 LAB — URINALYSIS, COMPLETE (UACMP) WITH MICROSCOPIC
Bacteria, UA: NONE SEEN
Bilirubin Urine: NEGATIVE
Glucose, UA: NEGATIVE mg/dL
Hgb urine dipstick: NEGATIVE
Ketones, ur: 20 mg/dL — AB
Leukocytes,Ua: NEGATIVE
Nitrite: NEGATIVE
Protein, ur: 100 mg/dL — AB
Specific Gravity, Urine: 1.017 (ref 1.005–1.030)
Squamous Epithelial / LPF: NONE SEEN (ref 0–5)
pH: 7 (ref 5.0–8.0)

## 2019-12-01 LAB — LIPASE, BLOOD: Lipase: 23 U/L (ref 11–51)

## 2019-12-01 MED ORDER — ONDANSETRON HCL 4 MG PO TABS: 4.0000 mg | ORAL_TABLET | Freq: Four times a day (QID) | ORAL | Status: DC | PRN

## 2019-12-01 MED ORDER — ASPIRIN EC 81 MG PO TBEC
81.0000 mg | DELAYED_RELEASE_TABLET | Freq: Every day | ORAL | Status: DC
  Administered 2019-12-02 – 2019-12-05 (×4): 81 mg via ORAL
  Filled 2019-12-01 (×4): qty 1

## 2019-12-01 MED ORDER — ONDANSETRON HCL 4 MG/2ML IJ SOLN
4.0000 mg | Freq: Once | INTRAMUSCULAR | Status: AC | PRN
  Administered 2019-12-01: 4 mg via INTRAVENOUS
  Filled 2019-12-01: qty 2

## 2019-12-01 MED ORDER — LACTATED RINGERS IV BOLUS: 1000.0000 mL | Freq: Once | INTRAVENOUS | Status: DC

## 2019-12-01 MED ORDER — ACETAMINOPHEN 650 MG RE SUPP: 650.0000 mg | Freq: Four times a day (QID) | RECTAL | Status: DC | PRN

## 2019-12-01 MED ORDER — AMLODIPINE BESYLATE 5 MG PO TABS: 2.5000 mg | ORAL_TABLET | Freq: Every day | ORAL | Status: DC

## 2019-12-01 MED ORDER — SODIUM CHLORIDE 0.9 % IV SOLN: INTRAVENOUS | Status: DC

## 2019-12-01 MED ORDER — MELATONIN 5 MG PO TABS
2.5000 mg | ORAL_TABLET | Freq: Every day | ORAL | Status: DC
  Administered 2019-12-01 – 2019-12-04 (×4): 2.5 mg via ORAL
  Filled 2019-12-01 (×3): qty 1
  Filled 2019-12-01: qty 0.5
  Filled 2019-12-01: qty 1

## 2019-12-01 MED ORDER — ALLOPURINOL 100 MG PO TABS
200.0000 mg | ORAL_TABLET | Freq: Every day | ORAL | Status: DC
  Administered 2019-12-02 – 2019-12-05 (×4): 200 mg via ORAL
  Filled 2019-12-01 (×6): qty 2

## 2019-12-01 MED ORDER — ONDANSETRON HCL 4 MG/2ML IJ SOLN: 4.0000 mg | Freq: Four times a day (QID) | INTRAMUSCULAR | Status: DC | PRN

## 2019-12-01 MED ORDER — MOMETASONE FURO-FORMOTEROL FUM 200-5 MCG/ACT IN AERO
2.0000 | INHALATION_SPRAY | Freq: Two times a day (BID) | RESPIRATORY_TRACT | Status: DC
  Administered 2019-12-03 – 2019-12-05 (×3): 2 via RESPIRATORY_TRACT
  Filled 2019-12-01: qty 8.8

## 2019-12-01 MED ORDER — SODIUM CHLORIDE 0.9 % IV BOLUS
1000.0000 mL | Freq: Once | INTRAVENOUS | Status: AC
  Administered 2019-12-01: 1000 mL via INTRAVENOUS

## 2019-12-01 MED ORDER — IPRATROPIUM-ALBUTEROL 0.5-2.5 (3) MG/3ML IN SOLN: 3.0000 mL | Freq: Four times a day (QID) | RESPIRATORY_TRACT | Status: DC | PRN

## 2019-12-01 MED ORDER — ENOXAPARIN SODIUM 30 MG/0.3ML ~~LOC~~ SOLN
30.0000 mg | SUBCUTANEOUS | Status: DC
  Filled 2019-12-01: qty 0.3

## 2019-12-01 MED ORDER — ADULT MULTIVITAMIN W/MINERALS CH
1.0000 | ORAL_TABLET | Freq: Every day | ORAL | Status: DC
  Administered 2019-12-02 – 2019-12-05 (×4): 1 via ORAL
  Filled 2019-12-01 (×4): qty 1

## 2019-12-01 MED ORDER — PANTOPRAZOLE SODIUM 40 MG IV SOLR
40.0000 mg | INTRAVENOUS | Status: DC
  Administered 2019-12-02 – 2019-12-05 (×4): 40 mg via INTRAVENOUS
  Filled 2019-12-01 (×4): qty 40

## 2019-12-01 MED ORDER — HYDRALAZINE HCL 20 MG/ML IJ SOLN
5.0000 mg | Freq: Four times a day (QID) | INTRAMUSCULAR | Status: DC | PRN
  Administered 2019-12-01 – 2019-12-04 (×4): 5 mg via INTRAVENOUS
  Filled 2019-12-01 (×4): qty 1

## 2019-12-01 MED ORDER — ENOXAPARIN SODIUM 40 MG/0.4ML ~~LOC~~ SOLN
40.0000 mg | SUBCUTANEOUS | Status: DC
  Administered 2019-12-01: 40 mg via SUBCUTANEOUS
  Filled 2019-12-01: qty 0.4

## 2019-12-01 MED ORDER — ACETAMINOPHEN 325 MG PO TABS: 650.0000 mg | ORAL_TABLET | Freq: Four times a day (QID) | ORAL | Status: DC | PRN

## 2019-12-01 NOTE — Progress Notes (Signed)
Pharmacy Lovenox Dosing  86 y.o. male admitted with Emesis . Patient ordered Lovenox 30 mg daily for VTE prophylaxis.   Filed Weights   12/01/19 0337  Weight: 56.2 kg (124 lb)    Body mass index is 25.04 kg/m.  Estimated Creatinine Clearance: 33.1 mL/min (by C-G formula based on SCr of 1.02 mg/dL).  Will adjust Lovenox dosing to 40 mg daily as CrCl>53ml/min  Chelcea Zahn A Strother Everitt 12/01/2019 7:08 PM

## 2019-12-01 NOTE — Consult Note (Signed)
[facility] GI Inpatient Consult Note   Kathline Magic, M.D.  Reason for Consult: Intractable nausea and vomiting, abnormal CT of the GI tract.   Attending Requesting Consult: Collier Bullock, MD  Outpatient Primary Physician: Thereasa Distance, M.D.  History of Present Illness: Colin Decker is a 86 y.o. male with a history of GERD, recently seen in our office by Effie Berkshire, PA-C for change in bowel habits with smaller caliber stools. He was sent for plain xray showing marked fecal retention in the large intestine. A followup abdominal CT scan was then ordered for reported mild weight loss, change in bowel habits, but did not tolerate the oral contrast and vomited it up. Noted on the CT was wall thickening in the area of the sigmoid colon surrounding multiple diverticula without gross inflammatory change. The findings on CT correlate with findings on colonoscopy in 2015 showing sigmoid diverticulosis.  Patient was deemed to have intractable nausea in the ED, though he says he has tolerated 2 clear liquid diet meals without vomiting or abdominal pain. He would like to go home unless it is absolutely necessary from a medical standpoint to be admitted to the hospital.  He had 2 sequential BMP measurements early this AM showing hyponatremia. Unfortunately, no IV resuscitation has been ordered and sodium levels have not been rechecked.   Past Medical History:  Past Medical History:  Diagnosis Date  . Asthma   . GERD (gastroesophageal reflux disease)   . Hyperlipidemia   . Hypertension     Problem List: Patient Active Problem List   Diagnosis Date Noted  . Hyponatremia 12/01/2019  . Refractory nausea and vomiting 12/01/2019  . Acute dehydration 12/01/2019  . Essential hypertension 12/01/2019  . 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 SHOULDER SURGERY Left     Allergies: Allergies   Allergen Reactions  . Biaxin [Clarithromycin] Nausea Only  . Codeine Sulfate Nausea Only  . Erythromycin Nausea Only  . Naprosyn [Naproxen] Nausea Only  . Augmentin [Amoxicillin-Pot Clavulanate] Rash    Home Medications: (Not in a hospital admission)  Home medication reconciliation was completed with the patient.   Scheduled Inpatient Medications:   . allopurinol  200 mg Oral Daily  . amLODipine  2.5 mg Oral Daily  . aspirin EC  81 mg Oral Daily  . enoxaparin (LOVENOX) injection  30 mg Subcutaneous Q24H  . mometasone-formoterol  2 puff Inhalation BID  . multivitamin with minerals  1 tablet Oral Daily  . pantoprazole (PROTONIX) IV  40 mg Intravenous Q24H    Continuous Inpatient Infusions:   . sodium chloride      PRN Inpatient Medications:  acetaminophen **OR** acetaminophen, ipratropium-albuterol, ondansetron **OR** ondansetron (ZOFRAN) IV  Family History: family history is not on file.   GI Family History: Negative  Social History:   reports that he has quit smoking. He has never used smokeless tobacco. He reports that he does not drink alcohol or use drugs. The patient denies ETOH, tobacco, or drug use.    Review of Systems: Review of Systems - General ROS: positive for  - fatigue negative for - fever, night sweats or weight loss Psychological ROS: negative Ophthalmic ROS: negative Allergy and Immunology ROS: negative Hematological and Lymphatic ROS: negative Endocrine ROS: negative Respiratory ROS: no cough, shortness of breath, or wheezing Cardiovascular ROS: no chest pain or dyspnea on exertion Genito-Urinary ROS: no dysuria, trouble voiding, or hematuria positive for -  mild kidney dysfunction Musculoskeletal ROS: negative Neurological ROS: positive for - weakness and hearing loss negative for - confusion, numbness/tingling, seizures or tremors Dermatological ROS: negative  Physical Examination: BP (!) 169/70 (BP Location: Left Arm)   Pulse 63   Temp  97.7 F (36.5 C) (Oral)   Resp 18   Ht 4\' 11"  (1.499 m)   Wt 56.2 kg   SpO2 98%   BMI 25.04 kg/m  Physical Exam  Data: Lab Results  Component Value Date   WBC 7.5 12/01/2019   HGB 12.3 (L) 12/01/2019   HCT 36.1 (L) 12/01/2019   MCV 98.9 12/01/2019   PLT 107 (L) 12/01/2019   Recent Labs  Lab 12/01/19 0341  HGB 12.3*   Lab Results  Component Value Date   NA 127 (L) 12/01/2019   K 4.4 12/01/2019   CL 97 (L) 12/01/2019   CO2 24 12/01/2019   BUN 19 12/01/2019   CREATININE 1.02 12/01/2019   Lab Results  Component Value Date   ALT 22 12/01/2019   AST 30 12/01/2019   ALKPHOS 60 12/01/2019   BILITOT 1.7 (H) 12/01/2019   No results for input(s): APTT, INR, PTT in the last 168 hours. CBC Latest Ref Rng & Units 12/01/2019 07/04/2011  WBC 4.0 - 10.5 K/uL 7.5 -  Hemoglobin 13.0 - 17.0 g/dL 12.3(L) 12.0(L)  Hematocrit 39.0 - 52.0 % 36.1(L) -  Platelets 150 - 400 K/uL 107(L) -    STUDIES: CT ABDOMEN PELVIS W CONTRAST  Result Date: 12/01/2019 CLINICAL DATA:  Cholecystectomy diverticulitis, change in bowel habits since January 2021 EXAM: CT ABDOMEN AND PELVIS WITH CONTRAST TECHNIQUE: Multidetector CT imaging of the abdomen and pelvis was performed using the standard protocol following bolus administration of intravenous contrast. CONTRAST:  25mL OMNIPAQUE IOHEXOL 300 MG/ML  SOLN COMPARISON:  CT abdomen 03/04/2011 FINDINGS: Lower chest: Mild elevation of the right hemidiaphragm similar to prior. Atelectatic changes in the lung bases. Lung bases are otherwise clear. Normal heart size. No pericardial effusion. Coronary artery atherosclerosis is present. Hepatobiliary: Right colon interposed anterior to the liver similar to prior exams. No focal liver abnormality is seen. Patient is post cholecystectomy. Slight prominence of the biliary tree likely related to reservoir effect. No calcified intraductal gallstones. Pancreas: Diffuse moderate pancreatic atrophy. Few punctate calcifications,  possibly vascular or related to prior inflammation. No acute peripancreatic inflammation or ductal dilatation. Spleen: Normal in size without focal abnormality. Adrenals/Urinary Tract: Normal adrenal glands. Kidneys enhance and excrete symmetrically. Fluid attenuation 3.8 cm cyst in the left kidney (2/30), likely simple cyst. Few additional subcentimeter hypertension foci in both kidneys too small to fully characterize on CT imaging but statistically likely benign. No worrisome renal lesions, urolithiasis or hydronephrosis. Urinary bladder is largely decompressed at the time of exam and therefore poorly evaluated by CT imaging. Mild circumferential bladder wall thickening is slightly greater than expected for underdistention with faint perivesicular haze. Stomach/Bowel: Distal esophagus, stomach and duodenal sweep are unremarkable. No small bowel wall thickening or dilatation. No evidence of obstruction. A normal appendix is visualized. Interposition of the ascending and hepatic flexure anterior to the colon. No proximal colonic dilatation or wall thickening. Portion of the sigmoid colon containing numerous colonic diverticula appears slightly thickened but without acute inflammation to suggest active diverticulitis. Remaining portions of the colon and rectum are unremarkable. Vascular/Lymphatic: Atherosclerotic calcifications throughout the abdominal aorta and branch vessels. No aneurysm or ectasia. No enlarged abdominopelvic lymph nodes. Reproductive: The prostate and seminal vesicles are unremarkable. Other: No abdominopelvic  free fluid or free gas. No bowel containing hernias. Musculoskeletal: No acute osseous abnormality or suspicious osseous lesion. Redemonstrated L5 pars defects with grade 2 anterolisthesis L5 on S1. Schmorl's node in the superior endplate L3. Levocurvature of the lumbar spine apex at L2-3. Additional degenerative changes in the hips and pelvis. IMPRESSION: 1. Portion of the sigmoid colon  containing numerous colonic diverticula appears slightly thickened but without acute inflammation to suggest active diverticulitis. Could reflect sequela of prior diverticular inflammation though should correlate with most recent colonoscopy or consider direct visualization if not recently performed. 2. No other acute or worrisome bowel abnormality. 3. Mild circumferential bladder wall thickening is slightly greater than expected for underdistention with faint perivesicular haze. Correlate with urinalysis to exclude cystitis. 4. Redemonstrated L5 pars defects with grade 2 anterolisthesis L5 on S1. 5. Aortic Atherosclerosis (ICD10-I70.0). Electronically Signed   By: Lovena Le M.D.   On: 12/01/2019 03:52   @IMAGES @  Assessment:  1. Nausea and vomiting - Patient no longer nauseous and tolerating po fluids.Possibly related to poor tolerance of oral CT contrast. Possible GERD exacerbation.  NO evidence of bowel obstruction on xray or CT scan. 2. Hypovolemic hyponatremia - Requires IV hydration to help correct GI fluid losses. 3. Abnormal CT scan - Diverticulosis without overt inflammatory changes, no masses.  4. Constipation, under poor control at present. 5. GERD with recently increased symptoms - Pepcid increased recently to 40mg  po qHS. Continued on Rabeprazole 20mg  qAM.  COVID-19 status: Tested negative     Recommendations: 1. Rehydrate. 2. Feed. 3. Ok to discharge from hospital fronm a GI standpoint if tolerating po. 4. Miralax 34g (two capfuls) po daily mixed in liquid of choice. 5. Follow up with Effie Berkshire, PA-C ([place]) in 2-3 weeks. 6. Repeat colonoscopy not considered necessary at this time.  Thank you for the consult. Please call with questions or concerns.  Olean Ree, "Lanny Hurst MD [facility] [place], [place] [address] [phone]  12/01/2019 6:08 PM

## 2019-12-01 NOTE — ED Provider Notes (Signed)
[facility] Emergency Department Provider Note  ____________________________________________  Time seen: Approximately 3:41 AM  I have reviewed the triage vital signs and the nursing notes.   HISTORY  Chief Complaint Emesis   HPI Colin Decker is a 86 y.o. male with history of asthma, GERD, hypertension, hyperlipidemia who presents for evaluation of nausea and vomiting.   Patient was sent for a CT abdomen pelvis by his GI doctor today due to change in bowel habits, decreased stool caliber, and constipation for 2 days.  This evening patient started having nausea and vomiting.  He vomited 5 times.  Nonbloody nonbilious.  He denies abdominal pain or distention, he is passing flatus.  He denies chest pain, shortness of breath, cough, fever or chills, dysuria or hematuria.  He has had cholecystectomy in the past but no history of SBO.  No history of colon cancer.  Past Medical History:  Diagnosis Date  . Asthma   . GERD (gastroesophageal reflux disease)   . Hyperlipidemia   . Hypertension     There are no problems to display for this patient.   Past Surgical History:  Procedure Laterality Date  . SHOULDER SURGERY Left     Prior to Admission medications   Medication Sig Start Date End Date Taking? Authorizing Provider  allopurinol (ZYLOPRIM) 100 MG tablet  12/09/18   [provider]  amLODipine (NORVASC) 10 MG tablet Take 20 mg by mouth daily.     [provider]  aspirin 81 MG tablet Take 81 mg by mouth daily.    [provider]  erythromycin ophthalmic ointment Place a 1/2 inch ribbon of ointment into the lower eyelid daily x 5 days x 1 week. 12/21/18   Karen Kitchens, NP  famotidine (PEPCID) 20 MG tablet Take by mouth.    [provider]  Fluticasone-Salmeterol (ADVAIR) 250-50 MCG/DOSE AEPB Inhale into the lungs. 01/26/18   [provider]  Ipratropium-Albuterol (COMBIVENT RESPIMAT) 20-100 MCG/ACT AERS  respimat Inhale 1 puff into the lungs every 6 (six) hours.    [provider]  Multiple Vitamin (MULTI-VITAMIN) tablet Take by mouth.    [provider]  calcium carbonate (OS-CAL - DOSED IN MG OF ELEMENTAL CALCIUM) 1250 (500 Ca) MG tablet Take 0.5 tablets by mouth.  12/21/18  [provider]  chlorthalidone (HYGROTON) 25 MG tablet Take 25 mg by mouth daily.  12/21/18  [provider]  febuxostat (ULORIC) 40 MG tablet Take 40 mg by mouth daily.  12/21/18  [provider]  lisinopril (PRINIVIL,ZESTRIL) 20 MG tablet Take 20 mg by mouth daily.  12/21/18  [provider]  RABEprazole (ACIPHEX) 20 MG tablet Take 20 mg by mouth daily.  12/21/18  [provider]    Allergies Biaxin [clarithromycin], Codeine sulfate, Erythromycin, Naprosyn [naproxen], and Augmentin [amoxicillin-pot clavulanate]  No family history on file.  Social History Social History   Tobacco Use  . Smoking status: Former Research scientist (life sciences)  . Smokeless tobacco: Never Used  Substance Use Topics  . Alcohol use: No  . Drug use: No    Review of Systems  Constitutional: Negative for fever. Eyes: Negative for visual changes. ENT: Negative for sore throat. Neck: No neck pain  Cardiovascular: Negative for chest pain. Respiratory: Negative for shortness of breath. Gastrointestinal: Negative for abdominal pain,  Diarrhea. + N/V Genitourinary: Negative for dysuria. Musculoskeletal: Negative for back pain. Skin: Negative for rash. Neurological: Negative for headaches, weakness or numbness. Psych: No SI or HI  ____________________________________________  PHYSICAL EXAM:  VITAL SIGNS: ED Triage Vitals  Enc Vitals Group     BP 12/01/19 0339 (!) 188/60     Pulse Rate 12/01/19 0339 65     Resp 12/01/19 0339 14     Temp 12/01/19 0339 97.7 F (36.5 C)     Temp Source 12/01/19 0339 Oral     SpO2 12/01/19 0339 100 %     Weight 12/01/19 0337 124 lb (56.2 kg)     Height  12/01/19 0337 4\' 11"  (1.499 m)     Head Circumference --      Peak Flow --      Pain Score 12/01/19 0337 0     Pain Loc --      Pain Edu? --      Excl. in [place]? --     Constitutional: Alert and oriented. Well appearing and in no apparent distress. HEENT:      Head: Normocephalic and atraumatic.         Eyes: Conjunctivae are normal. Sclera is non-icteric.       Mouth/Throat: Mucous membranes are moist.       Neck: Supple with no signs of meningismus. Cardiovascular: Regular rate and rhythm. No murmurs, gallops, or rubs. 2+ symmetrical distal pulses are present in all extremities.  Respiratory: Normal respiratory effort. Lungs are clear to auscultation bilaterally. No wheezes, crackles, or rhonchi.  Gastrointestinal: Soft, non tender, and non distended with positive bowel sounds. No rebound or guarding. Genitourinary: No CVA tenderness. Musculoskeletal: No edema, cyanosis, or erythema of extremities. Neurologic: Normal speech and language. Face is symmetric. Moving all extremities. No gross focal neurologic deficits are appreciated. Skin: Skin is warm, dry and intact. No rash noted. Psychiatric: Mood and affect are normal. Speech and behavior are normal.  ____________________________________________   LABS (all labs ordered are listed, but only abnormal results are displayed)  Labs Reviewed  COMPREHENSIVE METABOLIC PANEL - Abnormal; Notable for the following components:      Result Value   Sodium 127 (*)    Chloride 92 (*)    Glucose, Bld 116 (*)    Calcium 8.4 (*)    Total Protein 6.2 (*)    Total Bilirubin 1.7 (*)    GFR calc non Af Amer 59 (*)    All other components within normal limits  URINALYSIS, COMPLETE (UACMP) WITH MICROSCOPIC - Abnormal; Notable for the following components:   Color, Urine YELLOW (*)    APPearance CLEAR (*)    Ketones, ur 20 (*)    Protein, ur 100 (*)    All other components within normal limits  CBC WITH DIFFERENTIAL/PLATELET - Abnormal; Notable  for the following components:   RBC 3.65 (*)    Hemoglobin 12.3 (*)    HCT 36.1 (*)    Platelets 107 (*)    All other components within normal limits  BASIC METABOLIC PANEL - Abnormal; Notable for the following components:   Sodium 127 (*)    Chloride 97 (*)    Calcium 7.7 (*)    All other components within normal limits  LIPASE, BLOOD   ____________________________________________  EKG  ED ECG REPORT I, Rudene Re, the attending physician, personally viewed and interpreted this ECG.  Normal sinus rhythm, rate of 66, normal intervals, normal axis, no ST elevations or depressions.  Normal EKG. ____________________________________________  RADIOLOGY    CLINICAL DATA:  Cholecystectomy diverticulitis, change in bowel habits since January 2021  EXAM: CT ABDOMEN AND PELVIS WITH CONTRAST  TECHNIQUE: Multidetector  CT imaging of the abdomen and pelvis was performed using the standard protocol following bolus administration of intravenous contrast.  CONTRAST:  91mL OMNIPAQUE IOHEXOL 300 MG/ML  SOLN  COMPARISON:  CT abdomen 03/04/2011  FINDINGS: Lower chest: Mild elevation of the right hemidiaphragm similar to prior. Atelectatic changes in the lung bases. Lung bases are otherwise clear. Normal heart size. No pericardial effusion. Coronary artery atherosclerosis is present.  Hepatobiliary: Right colon interposed anterior to the liver similar to prior exams. No focal liver abnormality is seen. Patient is post cholecystectomy. Slight prominence of the biliary tree likely related to reservoir effect. No calcified intraductal gallstones.  Pancreas: Diffuse moderate pancreatic atrophy. Few punctate calcifications, possibly vascular or related to prior inflammation. No acute peripancreatic inflammation or ductal dilatation.  Spleen: Normal in size without focal abnormality.  Adrenals/Urinary Tract: Normal adrenal glands. Kidneys enhance and excrete  symmetrically. Fluid attenuation 3.8 cm cyst in the left kidney (2/30), likely simple cyst. Few additional subcentimeter hypertension foci in both kidneys too small to fully characterize on CT imaging but statistically likely benign. No worrisome renal lesions, urolithiasis or hydronephrosis. Urinary bladder is largely decompressed at the time of exam and therefore poorly evaluated by CT imaging. Mild circumferential bladder wall thickening is slightly greater than expected for underdistention with faint perivesicular haze.  Stomach/Bowel: Distal esophagus, stomach and duodenal sweep are unremarkable. No small bowel wall thickening or dilatation. No evidence of obstruction. A normal appendix is visualized. Interposition of the ascending and hepatic flexure anterior to the colon. No proximal colonic dilatation or wall thickening. Portion of the sigmoid colon containing numerous colonic diverticula appears slightly thickened but without acute inflammation to suggest active diverticulitis. Remaining portions of the colon and rectum are unremarkable.  Vascular/Lymphatic: Atherosclerotic calcifications throughout the abdominal aorta and branch vessels. No aneurysm or ectasia. No enlarged abdominopelvic lymph nodes.  Reproductive: The prostate and seminal vesicles are unremarkable.  Other: No abdominopelvic free fluid or free gas. No bowel containing hernias.  Musculoskeletal: No acute osseous abnormality or suspicious osseous lesion. Redemonstrated L5 pars defects with grade 2 anterolisthesis L5 on S1. Schmorl's node in the superior endplate L3. Levocurvature of the lumbar spine apex at L2-3. Additional degenerative changes in the hips and pelvis.  IMPRESSION: 1. Portion of the sigmoid colon containing numerous colonic diverticula appears slightly thickened but without acute inflammation to suggest active diverticulitis. Could reflect sequela of prior diverticular inflammation  though should correlate with most recent colonoscopy or consider direct visualization if not recently performed. 2. No other acute or worrisome bowel abnormality. 3. Mild circumferential bladder wall thickening is slightly greater than expected for underdistention with faint perivesicular haze. Correlate with urinalysis to exclude cystitis. 4. Redemonstrated L5 pars defects with grade 2 anterolisthesis L5 on S1. 5. Aortic Atherosclerosis (ICD10-I70.0).   Electronically Signed   By: Lovena Le M.D.   On: 12/01/2019 03:52     ____________________________________________   PROCEDURES  Procedure(s) performed:yes .1-3 Lead EKG Interpretation Performed by: Rudene Re, MD Authorized by: Rudene Re, MD     Interpretation: normal     ECG rate assessment: normal     Rhythm: sinus rhythm     Ectopy: none     Critical Care performed:  None ____________________________________________   INITIAL IMPRESSION / ASSESSMENT AND PLAN / ED COURSE  86 y.o. male with history of asthma, GERD, hypertension, hyperlipidemia who presents for evaluation of nausea and vomiting that started this evening.  Patient is well-appearing in no distress with normal vital signs, abdomen  is soft with no distention and positive bowel sounds.  Remainder of his exam is benign.     Differential diagnosis including gastritis versus GERD versus peptic ulcer disease versus pancreatitis versus SBO versus UTI.  Low suspicion for diverticulitis, appendicitis since patient has no abdominal pain or tenderness.  Luckily patient underwent a CT scan earlier today for changes in the caliber of his stool and his bowel habits since January.  I am able to see the CT which shows no evidence of SBO any acute abnormalities and that is confirmed by the radiologist.  His labs show hyponatremia and hypochloremia.  Will give a liter of normal saline.  No leukocytosis.  Stable mild anemia. Patient given IV Zofran.   Will p.o. challenge after fluids.  EKG was done showing no acute ischemia.  Patient placed on telemetry for close monitoring.  Old medical records reviewed.  _________________________ 6:07 AM on 12/01/2019 -----------------------------------------  Patient given a liter of normal saline with no change in his sodium therefore will admit to the hospitalist service.  Patient no longer vomiting, feels improved, tolerating p.o.    _____________________________________________ Please note:  Patient was evaluated in Emergency Department today for the symptoms described in the history of present illness. Patient was evaluated in the context of the global COVID-19 pandemic, which necessitated consideration that the patient might be at risk for infection with the SARS-CoV-2 virus that causes COVID-19. Institutional protocols and algorithms that pertain to the evaluation of patients at risk for COVID-19 are in a state of rapid change based on information released by regulatory bodies including the CDC and federal and state organizations. These policies and algorithms were followed during the patient's care in the ED.  Some ED evaluations and interventions may be delayed as a result of limited staffing during the pandemic.   [place] Controlled Substance Database was reviewed by me. ____________________________________________   FINAL CLINICAL IMPRESSION(S) / ED DIAGNOSES   Final diagnoses:  Non-intractable vomiting with nausea, unspecified vomiting type  Hyponatremia      NEW MEDICATIONS STARTED DURING THIS VISIT:  ED Discharge Orders    None       Note:  This document was prepared using Dragon voice recognition software and may include unintentional dictation errors.    Alfred Levins, [place], MD 12/01/19 [phone]

## 2019-12-01 NOTE — H&P (Signed)
History and Physical    Colin Decker KGM:[id number] DOB: 1927/11/24 DOA: 12/01/2019  PCP: Sofie Hartigan, MD   Patient coming from: Home  I have personally briefly reviewed patient's old medical records in [place]  Chief Complaint: Emesis   HPI: Colin Decker is a 86 y.o. male with medical history significant for asthma, GERD, hypertension and dyslipidemia who presented to the ER for evaluation of nausea and vomiting.  Patient was sent to the hospital by his primary care provider to get a CAT scan of the abdomen and pelvis for evaluation of change in bowel habits, constipation and decreased stool caliber.  Later that evening patient developed nausea and vomiting and had multiple episodes of nonbilious, nonbloody emesis.  He denies having any abdominal pain or distention and has been passing flatus. He denies having any chest pain, shortness of breath, cough, fever or chills, or urinary symptoms. Patient had a CT scan of abdomen and pelvis which showed portion of the sigmoid colon containing numerous colonic diverticula appears slightly thickened but without acute inflammation to suggest active diverticulitis. Could reflect sequela of prior diverticular inflammation though should correlate with most recent colonoscopy or consider direct visualization if not recently performed. Labs reveal a serum sodium of 124 and creatinine of 1.40  ED Course: Patient was seen in the emergency room for evaluation of refractory nausea and vomiting.  He had a CT scan of abdomen pelvis done earlier in the day for evaluation of change in his bowel habits, reduced stool caliber and constipation.  Labs reveal hyponatremia with a sodium of 127 and serum creatinine of 1.4.  Patient will be referred to observation status for further evaluation.  Review of Systems: As per HPI otherwise 10 point review of systems negative.    Past Medical History:  Diagnosis Date  . Asthma   . GERD  (gastroesophageal reflux disease)   . Hyperlipidemia   . Hypertension     Past Surgical History:  Procedure Laterality Date  . SHOULDER SURGERY Left      reports that he has quit smoking. He has never used smokeless tobacco. He reports that he does not drink alcohol or use drugs.  Allergies  Allergen Reactions  . Biaxin [Clarithromycin] Nausea Only  . Codeine Sulfate Nausea Only  . Erythromycin Nausea Only  . Naprosyn [Naproxen] Nausea Only  . Augmentin [Amoxicillin-Pot Clavulanate] Rash    No family history on file.   Prior to Admission medications   Medication Sig Start Date End Date Taking? Authorizing Provider  albuterol (VENTOLIN HFA) 108 (90 Base) MCG/ACT inhaler Inhale 1-2 puffs into the lungs every 4 (four) hours as needed for wheezing or shortness of breath.   Yes [provider]  allopurinol (ZYLOPRIM) 100 MG tablet Take 200 mg by mouth daily.  12/09/18  Yes [provider]  amLODipine (NORVASC) 2.5 MG tablet Take 2.5 mg by mouth daily.    Yes [provider]  aspirin 81 MG tablet Take 81 mg by mouth daily.   Yes [provider]  famotidine (PEPCID) 20 MG tablet Take 20 mg by mouth daily.    Yes [provider]  Multiple Vitamin (MULTI-VITAMIN) tablet Take 1 tablet by mouth.    Yes [provider]  Pramoxine-Benzalkonium Cl 1-0.13 % LIQD Apply topically.   Yes [provider]  erythromycin ophthalmic ointment Place a 1/2 inch ribbon of ointment into the lower eyelid daily x 5 days x 1 week. Patient not taking: Reported on  12/01/2019 12/21/18   Karen Kitchens, NP  Fluticasone-Salmeterol (ADVAIR) 250-50 MCG/DOSE AEPB Inhale 1 puff into the lungs 2 (two) times daily.  01/26/18   [provider]  Ipratropium-Albuterol (COMBIVENT RESPIMAT) 20-100 MCG/ACT AERS respimat Inhale 1 puff into the lungs every 6 (six) hours.    [provider]  calcium carbonate (OS-CAL - DOSED IN MG OF ELEMENTAL CALCIUM) 1250  (500 Ca) MG tablet Take 0.5 tablets by mouth.  12/21/18  [provider]  chlorthalidone (HYGROTON) 25 MG tablet Take 25 mg by mouth daily.  12/21/18  [provider]  febuxostat (ULORIC) 40 MG tablet Take 40 mg by mouth daily.  12/21/18  [provider]  lisinopril (PRINIVIL,ZESTRIL) 20 MG tablet Take 20 mg by mouth daily.  12/21/18  [provider]  RABEprazole (ACIPHEX) 20 MG tablet Take 20 mg by mouth daily.  12/21/18  [provider]    Physical Exam: Vitals:   12/01/19 0600 12/01/19 0630 12/01/19 0700 12/01/19 0730  BP: (!) 164/55 (!) 163/62 (!) 165/63 (!) 163/63  Pulse: 65 62 (!) 58 (!) 59  Resp: (!) 24 16 15  (!) 23  Temp:      TempSrc:      SpO2: 100% 100% 100% 100%  Weight:      Height:         Vitals:   12/01/19 0600 12/01/19 0630 12/01/19 0700 12/01/19 0730  BP: (!) 164/55 (!) 163/62 (!) 165/63 (!) 163/63  Pulse: 65 62 (!) 58 (!) 59  Resp: (!) 24 16 15  (!) 23  Temp:      TempSrc:      SpO2: 100% 100% 100% 100%  Weight:      Height:        Constitutional: NAD, alert and oriented x 3 Eyes: PERRL, lids and conjunctivae normal ENMT: Mucous membranes are moist.  Neck: normal, supple, no masses, no thyromegaly Respiratory: clear to auscultation bilaterally, no wheezing, no crackles. Normal respiratory effort. No accessory muscle use.  Cardiovascular: Regular rate and rhythm, no murmurs / rubs / gallops. No extremity edema. 2+ pedal pulses. No carotid bruits.  Abdomen: no tenderness, no masses palpated. No hepatosplenomegaly. Bowel sounds positive.  Musculoskeletal: no clubbing / cyanosis. No joint deformity upper and lower extremities.  Skin: no rashes, lesions, ulcers.  Neurologic: No gross focal neurologic deficit. Psychiatric: Normal mood and affect.   Labs on Admission: I have personally reviewed following labs and imaging studies  CBC: Recent Labs  Lab 12/01/19 0341  WBC 7.5  NEUTROABS 5.5  HGB 12.3*  HCT 36.1*    MCV 98.9  PLT 629*   Basic Metabolic Panel: Recent Labs  Lab 11/30/19 1033 12/01/19 0339 12/01/19 0544  NA  --  127* 127*  K  --  4.0 4.4  CL  --  92* 97*  CO2  --  25 24  GLUCOSE  --  116* 95  BUN  --  20 19  CREATININE 1.40* 1.11 1.02  CALCIUM  --  8.4* 7.7*   GFR: Estimated Creatinine Clearance: 33.1 mL/min (by C-G formula based on SCr of 1.02 mg/dL). Liver Function Tests: Recent Labs  Lab 12/01/19 0339  AST 30  ALT 22  ALKPHOS 60  BILITOT 1.7*  PROT 6.2*  ALBUMIN 4.0   Recent Labs  Lab 12/01/19 0339  LIPASE 23   No results for input(s): AMMONIA in the last 168 hours. Coagulation Profile: No results for input(s): INR, PROTIME in the last 168 hours. Cardiac Enzymes:  No results for input(s): CKTOTAL, CKMB, CKMBINDEX, TROPONINI in the last 168 hours. BNP (last 3 results) No results for input(s): PROBNP in the last 8760 hours. HbA1C: No results for input(s): HGBA1C in the last 72 hours. CBG: No results for input(s): GLUCAP in the last 168 hours. Lipid Profile: No results for input(s): CHOL, HDL, LDLCALC, TRIG, CHOLHDL, LDLDIRECT in the last 72 hours. Thyroid Function Tests: No results for input(s): TSH, T4TOTAL, FREET4, T3FREE, THYROIDAB in the last 72 hours. Anemia Panel: No results for input(s): VITAMINB12, FOLATE, FERRITIN, TIBC, IRON, RETICCTPCT in the last 72 hours. Urine analysis:    Component Value Date/Time   COLORURINE YELLOW (A) 12/01/2019 0339   APPEARANCEUR CLEAR (A) 12/01/2019 0339   LABSPEC 1.017 12/01/2019 0339   PHURINE 7.0 12/01/2019 0339   GLUCOSEU NEGATIVE 12/01/2019 0339   HGBUR NEGATIVE 12/01/2019 0339   BILIRUBINUR NEGATIVE 12/01/2019 0339   KETONESUR 20 (A) 12/01/2019 0339   PROTEINUR 100 (A) 12/01/2019 0339   NITRITE NEGATIVE 12/01/2019 0339   LEUKOCYTESUR NEGATIVE 12/01/2019 0339    Radiological Exams on Admission: CT ABDOMEN PELVIS W CONTRAST  Result Date: 12/01/2019 CLINICAL DATA:  Cholecystectomy diverticulitis,  change in bowel habits since January 2021 EXAM: CT ABDOMEN AND PELVIS WITH CONTRAST TECHNIQUE: Multidetector CT imaging of the abdomen and pelvis was performed using the standard protocol following bolus administration of intravenous contrast. CONTRAST:  24mL OMNIPAQUE IOHEXOL 300 MG/ML  SOLN COMPARISON:  CT abdomen 03/04/2011 FINDINGS: Lower chest: Mild elevation of the right hemidiaphragm similar to prior. Atelectatic changes in the lung bases. Lung bases are otherwise clear. Normal heart size. No pericardial effusion. Coronary artery atherosclerosis is present. Hepatobiliary: Right colon interposed anterior to the liver similar to prior exams. No focal liver abnormality is seen. Patient is post cholecystectomy. Slight prominence of the biliary tree likely related to reservoir effect. No calcified intraductal gallstones. Pancreas: Diffuse moderate pancreatic atrophy. Few punctate calcifications, possibly vascular or related to prior inflammation. No acute peripancreatic inflammation or ductal dilatation. Spleen: Normal in size without focal abnormality. Adrenals/Urinary Tract: Normal adrenal glands. Kidneys enhance and excrete symmetrically. Fluid attenuation 3.8 cm cyst in the left kidney (2/30), likely simple cyst. Few additional subcentimeter hypertension foci in both kidneys too small to fully characterize on CT imaging but statistically likely benign. No worrisome renal lesions, urolithiasis or hydronephrosis. Urinary bladder is largely decompressed at the time of exam and therefore poorly evaluated by CT imaging. Mild circumferential bladder wall thickening is slightly greater than expected for underdistention with faint perivesicular haze. Stomach/Bowel: Distal esophagus, stomach and duodenal sweep are unremarkable. No small bowel wall thickening or dilatation. No evidence of obstruction. A normal appendix is visualized. Interposition of the ascending and hepatic flexure anterior to the colon. No proximal  colonic dilatation or wall thickening. Portion of the sigmoid colon containing numerous colonic diverticula appears slightly thickened but without acute inflammation to suggest active diverticulitis. Remaining portions of the colon and rectum are unremarkable. Vascular/Lymphatic: Atherosclerotic calcifications throughout the abdominal aorta and branch vessels. No aneurysm or ectasia. No enlarged abdominopelvic lymph nodes. Reproductive: The prostate and seminal vesicles are unremarkable. Other: No abdominopelvic free fluid or free gas. No bowel containing hernias. Musculoskeletal: No acute osseous abnormality or suspicious osseous lesion. Redemonstrated L5 pars defects with grade 2 anterolisthesis L5 on S1. Schmorl's node in the superior endplate L3. Levocurvature of the lumbar spine apex at L2-3. Additional degenerative changes in the hips and pelvis. IMPRESSION: 1. Portion of the sigmoid colon containing numerous colonic diverticula appears slightly  thickened but without acute inflammation to suggest active diverticulitis. Could reflect sequela of prior diverticular inflammation though should correlate with most recent colonoscopy or consider direct visualization if not recently performed. 2. No other acute or worrisome bowel abnormality. 3. Mild circumferential bladder wall thickening is slightly greater than expected for underdistention with faint perivesicular haze. Correlate with urinalysis to exclude cystitis. 4. Redemonstrated L5 pars defects with grade 2 anterolisthesis L5 on S1. 5. Aortic Atherosclerosis (ICD10-I70.0). Electronically Signed   By: Lovena Le M.D.   On: 12/01/2019 03:52    EKG: Independently reviewed.  Normal sinus rhythm  Assessment/Plan Principal Problem:   Refractory nausea and vomiting Active Problems:   Hyponatremia   Acute dehydration     Refractory nausea and vomiting Unclear etiology may be secondary to acute gastritis Place patient on IV PPI IV antiemetics Trial  of clear liquids   Hyponatremia Most likely secondary to GI loss from nausea and vomiting Gentle IV fluid hydration   Acute dehydration From GI fluid losses IV fluid hydration   Hypertension Continue amlodipine   History of asthma Continue PRN bronchodilator therapy as well as inhaled steroids   DVT prophylaxis: Lovenox Code Status: Full Code Family Communication: Greater than 50% of time was spent discussing plan of care with patient at the bedside.  All questions and concerns have been addressed.  He verbalizes understanding and agrees with plan. Disposition Plan: Back to previous home environment Consults called: GI    Desteny Freeman MD [facility]     12/01/2019, 7:39 AM

## 2019-12-01 NOTE — ED Notes (Signed)
Pt given a ginger ale for a PO challenge. Pt has had not further episodes of emesis. Wife at the bedside.

## 2019-12-01 NOTE — ED Triage Notes (Signed)
Pt to the er via ems from the [place] for vomiting. Pt sates he feels horrible and is unable to sleep and now has nausea and vomiting. Pt has had changes in bowel habits with decreased appetite recently and a CT was performed yesterday but he has not gotten the results. No pain, no SOB. Pt reports very small stool. BP 180/90 on truck.

## 2019-12-01 NOTE — ED Notes (Signed)
Pt reports a relief of symptoms. Pt nausea has improved. Pt has had no more episodes of emesis.

## 2019-12-02 ENCOUNTER — Encounter: Payer: Self-pay | Admitting: Internal Medicine

## 2019-12-02 DIAGNOSIS — K219 Gastro-esophageal reflux disease without esophagitis: Secondary | ICD-10-CM | POA: Diagnosis present

## 2019-12-02 DIAGNOSIS — M109 Gout, unspecified: Secondary | ICD-10-CM | POA: Diagnosis present

## 2019-12-02 DIAGNOSIS — E861 Hypovolemia: Secondary | ICD-10-CM | POA: Diagnosis present

## 2019-12-02 DIAGNOSIS — Z885 Allergy status to narcotic agent status: Secondary | ICD-10-CM | POA: Diagnosis not present

## 2019-12-02 DIAGNOSIS — Z7951 Long term (current) use of inhaled steroids: Secondary | ICD-10-CM | POA: Diagnosis not present

## 2019-12-02 DIAGNOSIS — Z87891 Personal history of nicotine dependence: Secondary | ICD-10-CM | POA: Diagnosis not present

## 2019-12-02 DIAGNOSIS — I129 Hypertensive chronic kidney disease with stage 1 through stage 4 chronic kidney disease, or unspecified chronic kidney disease: Secondary | ICD-10-CM | POA: Diagnosis present

## 2019-12-02 DIAGNOSIS — I10 Essential (primary) hypertension: Secondary | ICD-10-CM

## 2019-12-02 DIAGNOSIS — Z880 Allergy status to penicillin: Secondary | ICD-10-CM | POA: Diagnosis not present

## 2019-12-02 DIAGNOSIS — Z881 Allergy status to other antibiotic agents status: Secondary | ICD-10-CM | POA: Diagnosis not present

## 2019-12-02 DIAGNOSIS — E878 Other disorders of electrolyte and fluid balance, not elsewhere classified: Secondary | ICD-10-CM | POA: Diagnosis present

## 2019-12-02 DIAGNOSIS — D696 Thrombocytopenia, unspecified: Secondary | ICD-10-CM | POA: Diagnosis present

## 2019-12-02 DIAGNOSIS — K5730 Diverticulosis of large intestine without perforation or abscess without bleeding: Secondary | ICD-10-CM | POA: Diagnosis present

## 2019-12-02 DIAGNOSIS — K5900 Constipation, unspecified: Secondary | ICD-10-CM | POA: Diagnosis present

## 2019-12-02 DIAGNOSIS — Z20822 Contact with and (suspected) exposure to covid-19: Secondary | ICD-10-CM | POA: Diagnosis present

## 2019-12-02 DIAGNOSIS — E871 Hypo-osmolality and hyponatremia: Secondary | ICD-10-CM | POA: Diagnosis present

## 2019-12-02 DIAGNOSIS — E860 Dehydration: Secondary | ICD-10-CM | POA: Diagnosis present

## 2019-12-02 DIAGNOSIS — E785 Hyperlipidemia, unspecified: Secondary | ICD-10-CM | POA: Diagnosis present

## 2019-12-02 DIAGNOSIS — Z9049 Acquired absence of other specified parts of digestive tract: Secondary | ICD-10-CM | POA: Diagnosis not present

## 2019-12-02 DIAGNOSIS — Z79899 Other long term (current) drug therapy: Secondary | ICD-10-CM | POA: Diagnosis not present

## 2019-12-02 DIAGNOSIS — R112 Nausea with vomiting, unspecified: Secondary | ICD-10-CM | POA: Diagnosis present

## 2019-12-02 DIAGNOSIS — Z7982 Long term (current) use of aspirin: Secondary | ICD-10-CM | POA: Diagnosis not present

## 2019-12-02 DIAGNOSIS — D631 Anemia in chronic kidney disease: Secondary | ICD-10-CM | POA: Diagnosis present

## 2019-12-02 DIAGNOSIS — J449 Chronic obstructive pulmonary disease, unspecified: Secondary | ICD-10-CM | POA: Diagnosis present

## 2019-12-02 DIAGNOSIS — R2681 Unsteadiness on feet: Secondary | ICD-10-CM | POA: Diagnosis present

## 2019-12-02 LAB — CBC
HCT: 28.8 % — ABNORMAL LOW (ref 39.0–52.0)
Hemoglobin: 10.4 g/dL — ABNORMAL LOW (ref 13.0–17.0)
MCH: 34.4 pg — ABNORMAL HIGH (ref 26.0–34.0)
MCHC: 36.1 g/dL — ABNORMAL HIGH (ref 30.0–36.0)
MCV: 95.4 fL (ref 80.0–100.0)
Platelets: 93 10*3/uL — ABNORMAL LOW (ref 150–400)
RBC: 3.02 MIL/uL — ABNORMAL LOW (ref 4.22–5.81)
RDW: 12.1 % (ref 11.5–15.5)
WBC: 6.5 10*3/uL (ref 4.0–10.5)
nRBC: 0 % (ref 0.0–0.2)

## 2019-12-02 LAB — BASIC METABOLIC PANEL
Anion gap: 6 (ref 5–15)
BUN: 17 mg/dL (ref 8–23)
CO2: 23 mmol/L (ref 22–32)
Calcium: 7.9 mg/dL — ABNORMAL LOW (ref 8.9–10.3)
Chloride: 97 mmol/L — ABNORMAL LOW (ref 98–111)
Creatinine, Ser: 1.07 mg/dL (ref 0.61–1.24)
GFR calc Af Amer: >60 mL/min (ref >60)
GFR calc non Af Amer: >60 mL/min (ref >60)
Glucose, Bld: 80 mg/dL (ref 70–99)
Potassium: 3.8 mmol/L (ref 3.5–5.1)
Sodium: 126 mmol/L — ABNORMAL LOW (ref 135–145)

## 2019-12-02 LAB — OSMOLALITY, URINE: Osmolality, Ur: 492 mOsm/kg (ref 300–900)

## 2019-12-02 LAB — TSH: TSH: 1.134 u[IU]/mL (ref 0.350–4.500)

## 2019-12-02 LAB — SODIUM, URINE, RANDOM: Sodium, Ur: 111 mmol/L

## 2019-12-02 MED ORDER — AMLODIPINE BESYLATE 5 MG PO TABS
5.0000 mg | ORAL_TABLET | Freq: Every day | ORAL | Status: DC
  Administered 2019-12-02 – 2019-12-05 (×4): 5 mg via ORAL
  Filled 2019-12-02 (×4): qty 1

## 2019-12-02 NOTE — Care Management Obs Status (Signed)
[facility] NOTIFICATION   Patient Details  Name: Colin Decker MRN: [id number] Date of Birth: 1928/03/04   Medicare Observation Status Notification Given:  Yes    Shelbie Ammons, RN 12/02/2019, 11:04 AM

## 2019-12-02 NOTE — Progress Notes (Signed)
Progress Note    Colin Decker  ION:[id number] DOB: December 05, 1927  DOA: 12/01/2019 PCP: Sofie Hartigan, MD      Brief Narrative:    Medical records reviewed and are as summarized below:  Colin Decker is a 86 y.o. male       Assessment/Plan:   Principal Problem:   Refractory nausea and vomiting Active Problems:   Hyponatremia   Acute dehydration   Essential hypertension   COPD with chronic bronchitis (HCC)   Refractory nausea and vomiting Improved.  Antiemetics as needed.  Advance diet.   Hyponatremia Sodium level is slightly worse today.  Most likely secondary to GI loss from nausea and vomiting Continue IV fluids and monitor BMP.   Acute dehydration From GI fluid losses IV fluid hydration   Hypertension Continue amlodipine   Acute thrombocytopenia His platelet count was normal at 173,000 on 11/16/2019.  Monitor CBC.   History of asthma Continue PRN bronchodilator therapy as well as inhaled steroids   Body mass index is 26.32 kg/m.   Family Communication/Anticipated D/C date and plan/Code Status   DVT prophylaxis: SCDs Code Status: Full code Family Communication: Plan discussed with patient Disposition Plan:    Status is: Inpatient  Remains inpatient appropriate because:IV treatments appropriate due to intensity of illness or inability to take PO   Dispo: The patient is from: Home              Anticipated d/c is to: Home              Anticipated d/c date is: 1 day              Patient currently is not medically stable to d/c.           Subjective:   No headache, dizziness, confusion, nausea, diarrhea or vomiting.  Objective:    Vitals:   12/02/19 0759 12/02/19 1048 12/02/19 1126 12/02/19 1243  BP: (!) 170/70 (!) 187/60 (!) 140/58 (!) 160/70  Pulse: 63 (!) 59 73 69  Resp:  14  14  Temp:  97.7 F (36.5 C) 97.9 F (36.6 C) 98 F (36.7 C)  TempSrc:  Oral Oral Oral  SpO2:  100% 99% 100%    Weight:      Height:       No data found.   Intake/Output Summary (Last 24 hours) at 12/02/2019 1452 Last data filed at 12/02/2019 0916 Gross per 24 hour  Intake 414.61 ml  Output 400 ml  Net 14.61 ml   Filed Weights   12/01/19 0337 12/01/19 2300  Weight: 56.2 kg 59.1 kg    Exam:  GEN: NAD SKIN: No rash EYES: EOMI ENT: MMM CV: RRR PULM: CTA B ABD: soft, ND, NT, +BS CNS: AAO x 3, non focal EXT: No edema or tenderness   Data Reviewed:   I have personally reviewed following labs and imaging studies:  Labs: Labs show the following:   Basic Metabolic Panel: Recent Labs  Lab 11/30/19 1033 12/01/19 0339 12/01/19 0339 12/01/19 0544 12/02/19 0514  NA  --  127*  --  127* 126*  K  --  4.0   < > 4.4 3.8  CL  --  92*  --  97* 97*  CO2  --  25  --  24 23  GLUCOSE  --  116*  --  95 80  BUN  --  20  --  19 17  CREATININE 1.40* 1.11  --  1.02  1.07  CALCIUM  --  8.4*  --  7.7* 7.9*   < > = values in this interval not displayed.   GFR Estimated Creatinine Clearance: 34.6 mL/min (by C-G formula based on SCr of 1.07 mg/dL). Liver Function Tests: Recent Labs  Lab 12/01/19 0339  AST 30  ALT 22  ALKPHOS 60  BILITOT 1.7*  PROT 6.2*  ALBUMIN 4.0   Recent Labs  Lab 12/01/19 0339  LIPASE 23   No results for input(s): AMMONIA in the last 168 hours. Coagulation profile No results for input(s): INR, PROTIME in the last 168 hours.  CBC: Recent Labs  Lab 12/01/19 0341 12/02/19 0514  WBC 7.5 6.5  NEUTROABS 5.5  --   HGB 12.3* 10.4*  HCT 36.1* 28.8*  MCV 98.9 95.4  PLT 107* 93*   Cardiac Enzymes: No results for input(s): CKTOTAL, CKMB, CKMBINDEX, TROPONINI in the last 168 hours. BNP (last 3 results) No results for input(s): PROBNP in the last 8760 hours. CBG: No results for input(s): GLUCAP in the last 168 hours. D-Dimer: No results for input(s): DDIMER in the last 72 hours. Hgb A1c: No results for input(s): HGBA1C in the last 72 hours. Lipid  Profile: No results for input(s): CHOL, HDL, LDLCALC, TRIG, CHOLHDL, LDLDIRECT in the last 72 hours. Thyroid function studies: No results for input(s): TSH, T4TOTAL, T3FREE, THYROIDAB in the last 72 hours.  Invalid input(s): FREET3 Anemia work up: No results for input(s): VITAMINB12, FOLATE, FERRITIN, TIBC, IRON, RETICCTPCT in the last 72 hours. Sepsis Labs: Recent Labs  Lab 12/01/19 0341 12/02/19 0514  WBC 7.5 6.5    Microbiology Recent Results (from the past 240 hour(s))  SARS Coronavirus 2 by RT PCR (hospital order, performed in [facility] hospital lab) Nasopharyngeal Nasopharyngeal Swab     Status: None   Collection Time: 12/01/19  7:37 AM   Specimen: Nasopharyngeal Swab  Result Value Ref Range Status   SARS Coronavirus 2 NEGATIVE NEGATIVE Final    Comment: (NOTE) SARS-CoV-2 target nucleic acids are NOT DETECTED. The SARS-CoV-2 RNA is generally detectable in upper and lower respiratory specimens during the acute phase of infection. The lowest concentration of SARS-CoV-2 viral copies this assay can detect is 250 copies / mL. A negative result does not preclude SARS-CoV-2 infection and should not be used as the sole basis for treatment or other patient management decisions.  A negative result may occur with improper specimen collection / handling, submission of specimen other than nasopharyngeal swab, presence of viral mutation(s) within the areas targeted by this assay, and inadequate number of viral copies (<250 copies / mL). A negative result must be combined with clinical observations, patient history, and epidemiological information. Fact Sheet for Patients:   StrictlyIdeas.no Fact Sheet for Healthcare Providers: BankingDealers.co.za This test is not yet approved or cleared  by the [place] FDA and has been authorized for detection and/or diagnosis of SARS-CoV-2 by FDA under an Emergency Use Authorization (EUA).   This EUA will remain in effect (meaning this test can be used) for the duration of the COVID-19 declaration under Section 564(b)(1) of the Act, 21 U.S.C. section 360bbb-3(b)(1), unless the authorization is terminated or revoked sooner. Performed at [facility], [address] [place], [place] [address]     Procedures and diagnostic studies:  No results found.  Medications:   . allopurinol  200 mg Oral Daily  . amLODipine  5 mg Oral Daily  . aspirin EC  81 mg Oral Daily  . enoxaparin (LOVENOX)  injection  40 mg Subcutaneous Q24H  . melatonin  2.5 mg Oral QHS  . mometasone-formoterol  2 puff Inhalation BID  . multivitamin with minerals  1 tablet Oral Daily  . pantoprazole (PROTONIX) IV  40 mg Intravenous Q24H   Continuous Infusions: . sodium chloride 100 mL/hr at 12/02/19 1113     LOS: 0 days   Xiara Knisley  [facility]     12/02/2019, 2:52 PM

## 2019-12-03 ENCOUNTER — Inpatient Hospital Stay: Payer: Medicare Other

## 2019-12-03 LAB — CBC WITH DIFFERENTIAL/PLATELET
Abs Immature Granulocytes: 0.05 10*3/uL (ref 0.00–0.07)
Basophils Absolute: 0 10*3/uL (ref 0.0–0.1)
Basophils Relative: 0 %
Eosinophils Absolute: 0.3 10*3/uL (ref 0.0–0.5)
Eosinophils Relative: 3 %
HCT: 33.4 % — ABNORMAL LOW (ref 39.0–52.0)
Hemoglobin: 11.7 g/dL — ABNORMAL LOW (ref 13.0–17.0)
Immature Granulocytes: 1 %
Lymphocytes Relative: 13 %
Lymphs Abs: 1.1 10*3/uL (ref 0.7–4.0)
MCH: 33.9 pg (ref 26.0–34.0)
MCHC: 35 g/dL (ref 30.0–36.0)
MCV: 96.8 fL (ref 80.0–100.0)
Monocytes Absolute: 0.7 10*3/uL (ref 0.1–1.0)
Monocytes Relative: 9 %
Neutro Abs: 6 10*3/uL (ref 1.7–7.7)
Neutrophils Relative %: 74 %
Platelets: 108 10*3/uL — ABNORMAL LOW (ref 150–400)
RBC: 3.45 MIL/uL — ABNORMAL LOW (ref 4.22–5.81)
RDW: 12 % (ref 11.5–15.5)
WBC: 8.2 10*3/uL (ref 4.0–10.5)
nRBC: 0 % (ref 0.0–0.2)

## 2019-12-03 LAB — BASIC METABOLIC PANEL
Anion gap: 11 (ref 5–15)
BUN: 14 mg/dL (ref 8–23)
CO2: 20 mmol/L — ABNORMAL LOW (ref 22–32)
Calcium: 8 mg/dL — ABNORMAL LOW (ref 8.9–10.3)
Chloride: 94 mmol/L — ABNORMAL LOW (ref 98–111)
Creatinine, Ser: 0.94 mg/dL (ref 0.61–1.24)
GFR calc Af Amer: >60 mL/min (ref >60)
GFR calc non Af Amer: >60 mL/min (ref >60)
Glucose, Bld: 74 mg/dL (ref 70–99)
Potassium: 3.7 mmol/L (ref 3.5–5.1)
Sodium: 125 mmol/L — ABNORMAL LOW (ref 135–145)

## 2019-12-03 LAB — CORTISOL: Cortisol, Plasma: 22.5 ug/dL

## 2019-12-03 MED ORDER — TOLVAPTAN 15 MG PO TABS
15.0000 mg | ORAL_TABLET | ORAL | Status: DC
  Administered 2019-12-04: 15 mg via ORAL
  Filled 2019-12-03 (×2): qty 1

## 2019-12-03 NOTE — Progress Notes (Addendum)
Progress Note    Colin Decker  GYI:[id number] DOB: 1928-05-08  DOA: 12/01/2019 PCP: Sofie Hartigan, MD      Brief Narrative:    Medical records reviewed and are as summarized below:  Colin Decker is a 86 y.o. male       Assessment/Plan:   Principal Problem:   Refractory nausea and vomiting Active Problems:   Hyponatremia   Acute dehydration   Essential hypertension   COPD with chronic bronchitis (HCC)   Refractory nausea and vomiting Improved.  Antiemetics as needed.  Advance diet.   Hyponatremia Sodium level is worsening. Discontinue IV fluids and monitor BMP. TSH and cortisol levels were normal. Urine sodium is 111 and your osmolality is 192. Suspect SIADH. Consulted nephrologist to assist with management. Treat with tolvaptan and monitor sodium level closely.   Acute dehydration Improved   Hypertension Continue amlodipine   Acute thrombocytopenia Platelet count is slightly trending up today. His platelet count was normal at 173,000 on 11/16/2019.  Monitor CBC.   CKD stage IIIa Creatinine is stable   History of asthma Continue PRN bronchodilator therapy as well as inhaled steroids   Body mass index is 26.32 kg/m.   Family Communication/Anticipated D/C date and plan/Code Status   DVT prophylaxis: SCDs Code Status: Full code Family Communication: Plan discussed with his son at the bedside Disposition Plan:    Status is: Inpatient  Remains inpatient appropriate because:Inpatient level of care appropriate due to severity of illness   Dispo: The patient is from: Home              Anticipated d/c is to: Home              Anticipated d/c date is: 1 day              Patient currently is not medically stable to d/c.           Subjective:   No headache, dizziness, confusion, nausea, diarrhea or vomiting.  Objective:    Vitals:   12/02/19 2103 12/03/19 0429 12/03/19 0557 12/03/19 1100  BP: (!) 160/71  (!) 173/75 (!) 158/62 (!) 145/65  Pulse:  65 85 77  Resp:  18 17   Temp:  98.4 F (36.9 C) 98.5 F (36.9 C) (!) 97.5 F (36.4 C)  TempSrc:  Oral Oral Oral  SpO2:  97% 98% 99%  Weight:      Height:       No data found.   Intake/Output Summary (Last 24 hours) at 12/03/2019 1328 Last data filed at 12/03/2019 1012 Gross per 24 hour  Intake 2648.89 ml  Output 1900 ml  Net 748.89 ml   Filed Weights   12/01/19 0337 12/01/19 2300  Weight: 56.2 kg 59.1 kg    Exam:  GEN: NAD SKIN: No rash EYES: No pallor or icterus ENT: MMM CV: RRR PULM: No wheezing or rales heard ABD: soft, ND, NT, +BS CNS: AAO x 3, non focal EXT: No edema or tenderness   Data Reviewed:   I have personally reviewed following labs and imaging studies:  Labs: Labs show the following:   Basic Metabolic Panel: Recent Labs  Lab 11/30/19 1033 12/01/19 0339 12/01/19 0339 12/01/19 0544 12/01/19 0544 12/02/19 0514 12/03/19 0448  NA  --  127*  --  127*  --  126* 125*  K  --  4.0   < > 4.4   < > 3.8 3.7  CL  --  92*  --  97*  --  97* 94*  CO2  --  25  --  24  --  23 20*  GLUCOSE  --  116*  --  95  --  80 74  BUN  --  20  --  19  --  17 14  CREATININE 1.40* 1.11  --  1.02  --  1.07 0.94  CALCIUM  --  8.4*  --  7.7*  --  7.9* 8.0*   < > = values in this interval not displayed.   GFR Estimated Creatinine Clearance: 39.4 mL/min (by C-G formula based on SCr of 0.94 mg/dL). Liver Function Tests: Recent Labs  Lab 12/01/19 0339  AST 30  ALT 22  ALKPHOS 60  BILITOT 1.7*  PROT 6.2*  ALBUMIN 4.0   Recent Labs  Lab 12/01/19 0339  LIPASE 23   No results for input(s): AMMONIA in the last 168 hours. Coagulation profile No results for input(s): INR, PROTIME in the last 168 hours.  CBC: Recent Labs  Lab 12/01/19 0341 12/02/19 0514 12/03/19 0448  WBC 7.5 6.5 8.2  NEUTROABS 5.5  --  6.0  HGB 12.3* 10.4* 11.7*  HCT 36.1* 28.8* 33.4*  MCV 98.9 95.4 96.8  PLT 107* 93* 108*   Cardiac  Enzymes: No results for input(s): CKTOTAL, CKMB, CKMBINDEX, TROPONINI in the last 168 hours. BNP (last 3 results) No results for input(s): PROBNP in the last 8760 hours. CBG: No results for input(s): GLUCAP in the last 168 hours. D-Dimer: No results for input(s): DDIMER in the last 72 hours. Hgb A1c: No results for input(s): HGBA1C in the last 72 hours. Lipid Profile: No results for input(s): CHOL, HDL, LDLCALC, TRIG, CHOLHDL, LDLDIRECT in the last 72 hours. Thyroid function studies: Recent Labs    12/02/19 0514  TSH 1.134   Anemia work up: No results for input(s): VITAMINB12, FOLATE, FERRITIN, TIBC, IRON, RETICCTPCT in the last 72 hours. Sepsis Labs: Recent Labs  Lab 12/01/19 0341 12/02/19 0514 12/03/19 0448  WBC 7.5 6.5 8.2    Microbiology Recent Results (from the past 240 hour(s))  SARS Coronavirus 2 by RT PCR (hospital order, performed in [facility] hospital lab) Nasopharyngeal Nasopharyngeal Swab     Status: None   Collection Time: 12/01/19  7:37 AM   Specimen: Nasopharyngeal Swab  Result Value Ref Range Status   SARS Coronavirus 2 NEGATIVE NEGATIVE Final    Comment: (NOTE) SARS-CoV-2 target nucleic acids are NOT DETECTED. The SARS-CoV-2 RNA is generally detectable in upper and lower respiratory specimens during the acute phase of infection. The lowest concentration of SARS-CoV-2 viral copies this assay can detect is 250 copies / mL. A negative result does not preclude SARS-CoV-2 infection and should not be used as the sole basis for treatment or other patient management decisions.  A negative result may occur with improper specimen collection / handling, submission of specimen other than nasopharyngeal swab, presence of viral mutation(s) within the areas targeted by this assay, and inadequate number of viral copies (<250 copies / mL). A negative result must be combined with clinical observations, patient history, and epidemiological information. Fact Sheet for  Patients:   StrictlyIdeas.no Fact Sheet for Healthcare Providers: BankingDealers.co.za This test is not yet approved or cleared  by the [place] FDA and has been authorized for detection and/or diagnosis of SARS-CoV-2 by FDA under an Emergency Use Authorization (EUA).  This EUA will remain in effect (meaning this test can be used) for the duration of the COVID-19 declaration under  Section 564(b)(1) of the Act, 21 U.S.C. section 360bbb-3(b)(1), unless the authorization is terminated or revoked sooner. Performed at [facility], [address] [place], [place] [address]     Procedures and diagnostic studies:  DG Chest [facility] 1 View  Result Date: 12/03/2019 CLINICAL DATA:  Hyponatremia EXAM: PORTABLE CHEST 1 VIEW COMPARISON:  01/26/2011 FINDINGS: The heart size and mediastinal contours are within normal limits. Bo No focal airspace consolidation, pleural effusion, or pneumothorax. The visualized skeletal structures are unremarkable. There is a small amount of residual contrast within the visualized colon. IMPRESSION: No active disease. Electronically Signed   By: Davina Poke D.O.   On: 12/03/2019 11:26    Medications:   . allopurinol  200 mg Oral Daily  . amLODipine  5 mg Oral Daily  . aspirin EC  81 mg Oral Daily  . melatonin  2.5 mg Oral QHS  . mometasone-formoterol  2 puff Inhalation BID  . multivitamin with minerals  1 tablet Oral Daily  . pantoprazole (PROTONIX) IV  40 mg Intravenous Q24H   Continuous Infusions:    LOS: 1 day   Raylon Lamson  [facility]     12/03/2019, 1:28 PM

## 2019-12-03 NOTE — Progress Notes (Signed)
[facility]  ROUNDING NOTE   Subjective:   [name] admitted to [facility] on 12/01/2019 for Hyponatremia [E87.1] Refractory nausea and vomiting [R11.2] Non-intractable vomiting with nausea, unspecified vomiting type [R11.2] Patient was found to have a serum sodium of 127 on admission. He was started on IV normal saline which has made patient feel better but his sodium levels have not improved. 125 today.   Nephrology consulted.   Objective:  Vital signs in last 24 hours:  Temp:  [97.5 F (36.4 C)-98.5 F (36.9 C)] 97.5 F (36.4 C) (06/11 1100) Pulse Rate:  [65-85] 77 (06/11 1100) Resp:  [14-22] 17 (06/11 0557) BP: (145-173)/(62-75) 145/65 (06/11 1100) SpO2:  [97 %-100 %] 99 % (06/11 1100)  Weight change:  Filed Weights   12/01/19 0337 12/01/19 2300  Weight: 56.2 kg 59.1 kg    Intake/Output: I/O last 3 completed shifts: In: 534.6 [P.O.:120; I.V.:414.6] Out: 2050 [Urine:2050]   Intake/Output this shift:  Total I/O In: 2528.9 [I.V.:2528.9] Out: 250 [Urine:250]  Physical Exam: General: NAD,   Head: Normocephalic, atraumatic. Moist oral mucosal membranes  Eyes: Anicteric, PERRL  Neck: Supple, trachea midline  Lungs:  Clear to auscultation  Heart: Regular rate and rhythm  Abdomen:  Soft, nontender,   Extremities: no peripheral edema.  Neurologic: Nonfocal, moving all four extremities  Skin: No lesions        Basic Metabolic Panel: Recent Labs  Lab 11/30/19 1033 12/01/19 0339 12/01/19 0339 12/01/19 0544 12/02/19 0514 12/03/19 0448  NA  --  127*  --  127* 126* 125*  K  --  4.0  --  4.4 3.8 3.7  CL  --  92*  --  97* 97* 94*  CO2  --  25  --  24 23 20*  GLUCOSE  --  116*  --  95 80 74  BUN  --  20  --  19 17 14   CREATININE 1.40* 1.11  --  1.02 1.07 0.94  CALCIUM  --  8.4*   < > 7.7* 7.9* 8.0*   < > = values in this interval not displayed.    Liver Function Tests: Recent Labs  Lab 12/01/19 0339  AST 30  ALT 22  ALKPHOS 60   BILITOT 1.7*  PROT 6.2*  ALBUMIN 4.0   Recent Labs  Lab 12/01/19 0339  LIPASE 23   No results for input(s): AMMONIA in the last 168 hours.  CBC: Recent Labs  Lab 12/01/19 0341 12/02/19 0514 12/03/19 0448  WBC 7.5 6.5 8.2  NEUTROABS 5.5  --  6.0  HGB 12.3* 10.4* 11.7*  HCT 36.1* 28.8* 33.4*  MCV 98.9 95.4 96.8  PLT 107* 93* 108*    Cardiac Enzymes: No results for input(s): CKTOTAL, CKMB, CKMBINDEX, TROPONINI in the last 168 hours.  BNP: Invalid input(s): POCBNP  CBG: No results for input(s): GLUCAP in the last 168 hours.  Microbiology: Results for orders placed or performed during the hospital encounter of 12/01/19  SARS Coronavirus 2 by RT PCR (hospital order, performed in [facility] hospital lab) Nasopharyngeal Nasopharyngeal Swab     Status: None   Collection Time: 12/01/19  7:37 AM   Specimen: Nasopharyngeal Swab  Result Value Ref Range Status   SARS Coronavirus 2 NEGATIVE NEGATIVE Final    Comment: (NOTE) SARS-CoV-2 target nucleic acids are NOT DETECTED. The SARS-CoV-2 RNA is generally detectable in upper and lower respiratory specimens during the acute phase of infection. The lowest concentration of SARS-CoV-2 viral copies this  assay can detect is 250 copies / mL. A negative result does not preclude SARS-CoV-2 infection and should not be used as the sole basis for treatment or other patient management decisions.  A negative result may occur with improper specimen collection / handling, submission of specimen other than nasopharyngeal swab, presence of viral mutation(s) within the areas targeted by this assay, and inadequate number of viral copies (<250 copies / mL). A negative result must be combined with clinical observations, patient history, and epidemiological information. Fact Sheet for Patients:   StrictlyIdeas.no Fact Sheet for Healthcare Providers: BankingDealers.co.za This test is not yet approved  or cleared  by the [place] FDA and has been authorized for detection and/or diagnosis of SARS-CoV-2 by FDA under an Emergency Use Authorization (EUA).  This EUA will remain in effect (meaning this test can be used) for the duration of the COVID-19 declaration under Section 564(b)(1) of the Act, 21 U.S.C. section 360bbb-3(b)(1), unless the authorization is terminated or revoked sooner. Performed at [facility], [address] [place], [place] [address]     Coagulation Studies: No results for input(s): LABPROT, INR in the last 72 hours.  Urinalysis: Recent Labs    12/01/19 0339  COLORURINE YELLOW*  LABSPEC 1.017  PHURINE 7.0  GLUCOSEU NEGATIVE  HGBUR NEGATIVE  BILIRUBINUR NEGATIVE  KETONESUR 20*  PROTEINUR 100*  NITRITE NEGATIVE  LEUKOCYTESUR NEGATIVE      Imaging: DG Chest Port 1 View  Result Date: 12/03/2019 CLINICAL DATA:  Hyponatremia EXAM: PORTABLE CHEST 1 VIEW COMPARISON:  01/26/2011 FINDINGS: The heart size and mediastinal contours are within normal limits. Bo No focal airspace consolidation, pleural effusion, or pneumothorax. The visualized skeletal structures are unremarkable. There is a small amount of residual contrast within the visualized colon. IMPRESSION: No active disease. Electronically Signed   By: Davina Poke D.O.   On: 12/03/2019 11:26     Medications:    . allopurinol  200 mg Oral Daily  . amLODipine  5 mg Oral Daily  . aspirin EC  81 mg Oral Daily  . melatonin  2.5 mg Oral QHS  . mometasone-formoterol  2 puff Inhalation BID  . multivitamin with minerals  1 tablet Oral Daily  . pantoprazole (PROTONIX) IV  40 mg Intravenous Q24H   acetaminophen **OR** acetaminophen, hydrALAZINE, ipratropium-albuterol, ondansetron **OR** ondansetron (ZOFRAN) IV  Assessment/ Plan:  [name] is a 86 y.o. white male with hypertension, GERD, COPD, hyperlipidemia, pancreatitis who is admitted to [facility] on 12/01/2019 for Hyponatremia  [E87.1] Refractory nausea and vomiting [R11.2] Non-intractable vomiting with nausea, unspecified vomiting type [R11.2]  1. Hyponatremia: hypovolemic on admission. Euvolemic on examination today.  - Hold IV fluids - Tolvaptan today.   2. Chronic kidney disease stage IIIA: creatinine and GFR are better than baseline. Creatinine was 1.4, GFR of 48 on 08/25/19. Not currently taking an ACE-I/ARB.   3. Hypertension: 145/65. No edema on examination.  - restart amlodipine.   4. Gout: no acute gout flares.    LOS: 1 Gunda Maqueda 6/11/202112:39 PM

## 2019-12-03 NOTE — Evaluation (Signed)
Physical Therapy Evaluation Patient Details Name: Colin Decker MRN: [id number] DOB: [birth year] Today's Date: 12/03/2019   History of Present Illness  Pt is an 86 y.o. male presenting to hospital 6/9 with N/V, change in bowel habits, and constipation.  Pt admitted with refractory N/V, hyponatremia, and acute dehydration.  PMH includes asthma, GERD, htn, HLD, and h/o L shoulder surgery.  Clinical Impression  Prior to hospital admission, pt was independent with ambulation; lives with his wife at the [facility].  Currently pt is modified independent with bed mobility; independent with transfers; and CGA with ambulation around nursing loop with RW.  Pt requesting to use walker d/t providing stability and d/t generalized weakness (pt appearing mildly shaky but no loss of balance noted); limited distance ambulating d/t pt reporting L sciatic pain starting to increase (3/10 end of ambulation; 0/10 at rest in bed).  Pt would benefit from skilled PT to address noted impairments and functional limitations (see below for any additional details).  Upon hospital discharge, pt would benefit from [place].    Follow Up Recommendations Home health PT    Equipment Recommendations  Rolling walker with 5" wheels (pt plans to use his wife's walker)    Recommendations for Other Services       Precautions / Restrictions Precautions Precautions: Fall Restrictions Weight Bearing Restrictions: No      Mobility  Bed Mobility Overal bed mobility: Modified Independent             General bed mobility comments: Semi-supine to/from sitting edge of bed without any noted difficulties.  Transfers Overall transfer level: Independent               General transfer comment: fairly strong stand up from bed x2 trials  Ambulation/Gait Ambulation/Gait assistance: Min guard Gait Distance (Feet): 200 Feet Assistive device: Rolling walker (2 wheeled) Gait Pattern/deviations: Step-through  pattern Gait velocity: mildly decreased   General Gait Details: mildly shaky but steady with RW use  Stairs            Wheelchair Mobility    Modified Rankin (Stroke Patients Only)       Balance Overall balance assessment: Needs assistance Sitting-balance support: No upper extremity supported;Feet supported Sitting balance-Leahy Scale: Normal Sitting balance - Comments: steady sitting reaching outside BOS   Standing balance support: No upper extremity supported Standing balance-Leahy Scale: Good Standing balance comment: pt mildly shaky standing using urinal but no loss of balance noted                             Pertinent Vitals/Pain Pain Assessment: 0-10 Pain Score: 0-No pain (2-3/10 with ambulation) Pain Descriptors / Indicators: Aching Pain Intervention(s): Limited activity within patient's tolerance;Monitored during session;Repositioned  Vitals (HR and O2 on room air) stable and WFL throughout treatment session.    Home Living Family/patient expects to be discharged to:: Assisted living (Lives with wife (almost 77 years of marriage))               Home Equipment: Environmental consultant - 2 wheels (pt's wife) Additional Comments: [place]    Prior Function Level of Independence: Independent         Comments: Pt and pt's wife report no falls in past 6 months.  Was going to start OP PT today for L sciatic pain but missed appointment d/t being in hospital.     Hand Dominance        Extremity/Trunk  Assessment   Upper Extremity Assessment Upper Extremity Assessment: Generalized weakness    Lower Extremity Assessment Lower Extremity Assessment: Generalized weakness    Cervical / Trunk Assessment Cervical / Trunk Assessment: Normal  Communication   Communication: No difficulties  Cognition Arousal/Alertness: Awake/alert Behavior During Therapy: WFL for tasks assessed/performed Overall Cognitive Status: Within Functional Limits for  tasks assessed                                        General Comments   Nursing cleared pt for participation in physical therapy.  Pt agreeable to PT session.  Pt's wife present most of session (left towards end of session).    Exercises  Ambulation with RW   Assessment/Plan    PT Assessment Patient needs continued PT services  PT Problem List Decreased strength;Decreased balance;Decreased activity tolerance;Decreased mobility;Decreased knowledge of use of DME;Pain       PT Treatment Interventions DME instruction;Gait training;Functional mobility training;Therapeutic activities;Therapeutic exercise;Balance training;Patient/family education    PT Goals (Current goals can be found in the Care Plan section)  Acute Rehab PT Goals Patient Stated Goal: to go home PT Goal Formulation: With patient Time For Goal Achievement: 12/17/19 Potential to Achieve Goals: Good    Frequency Min 2X/week   Barriers to discharge        Co-evaluation               AM-PAC PT "6 Clicks" Mobility  Outcome Measure Help needed turning from your back to your side while in a flat bed without using bedrails?: None Help needed moving from lying on your back to sitting on the side of a flat bed without using bedrails?: None Help needed moving to and from a bed to a chair (including a wheelchair)?: None Help needed standing up from a chair using your arms (e.g., wheelchair or bedside chair)?: None Help needed to walk in hospital room?: A Little Help needed climbing 3-5 steps with a railing? : A Little 6 Click Score: 22    End of Session Equipment Utilized During Treatment: Gait belt Activity Tolerance: Patient tolerated treatment well Patient left: in bed;with call bell/phone within reach;with bed alarm set Nurse Communication: Mobility status;Precautions PT Visit Diagnosis: Unsteadiness on feet (R26.81);Muscle weakness (generalized) (M62.81);Other abnormalities of gait and  mobility (R26.89)    Time: 2993-7169 PT Time Calculation (min) (ACUTE ONLY): 30 min   Charges:   PT Evaluation $PT Eval Low Complexity: 1 Low PT Treatments $Therapeutic Exercise: 8-22 mins       Leitha Bleak, PT 12/03/19, 4:47 PM

## 2019-12-03 NOTE — Plan of Care (Signed)
Continuing with plan of care. 

## 2019-12-04 LAB — SODIUM: Sodium: 126 mmol/L — ABNORMAL LOW (ref 135–145)

## 2019-12-04 LAB — BASIC METABOLIC PANEL
Anion gap: 11 (ref 5–15)
BUN: 13 mg/dL (ref 8–23)
CO2: 19 mmol/L — ABNORMAL LOW (ref 22–32)
Calcium: 8.3 mg/dL — ABNORMAL LOW (ref 8.9–10.3)
Chloride: 93 mmol/L — ABNORMAL LOW (ref 98–111)
Creatinine, Ser: 1.01 mg/dL (ref 0.61–1.24)
GFR calc Af Amer: >60 mL/min (ref >60)
GFR calc non Af Amer: >60 mL/min (ref >60)
Glucose, Bld: 73 mg/dL (ref 70–99)
Potassium: 3.6 mmol/L (ref 3.5–5.1)
Sodium: 123 mmol/L — ABNORMAL LOW (ref 135–145)

## 2019-12-04 LAB — LIPID PANEL
Cholesterol: 152 mg/dL (ref 0–200)
HDL: 67 mg/dL (ref >40)
LDL Cholesterol: 70 mg/dL (ref 0–99)
Total CHOL/HDL Ratio: 2.3 RATIO
Triglycerides: 73 mg/dL (ref <150)
VLDL: 15 mg/dL (ref 0–40)

## 2019-12-04 NOTE — Plan of Care (Signed)
Continuing with plan of care. 

## 2019-12-04 NOTE — Progress Notes (Signed)
Progress Note    Colin Decker  FIE:[id number] DOB: 1927-07-29  DOA: 12/01/2019 PCP: Sofie Hartigan, MD      Brief Narrative:    Medical records reviewed and are as summarized below:  Colin Decker is a 86 y.o. male       Assessment/Plan:   Principal Problem:   Refractory nausea and vomiting Active Problems:   Hyponatremia   Acute dehydration   Essential hypertension   COPD with chronic bronchitis (HCC)   Refractory nausea and vomiting Improved.  Antiemetics as needed.  Advance diet.   Hyponatremia Sodium level is still getting worse.  IV fluids was discontinued on the morning of 12/03/2019.  Tolvaptan was started on 12/03/2019.  Continue tolvaptan. TSH, lipid panel and cortisol levels were normal. Urine sodium is 111 and urine osmolality is 192. Suspect SIADH.  Follow-up with nephrologist.   Acute dehydration Improved   Hypertension Continue amlodipine   Acute thrombocytopenia Platelet count is stable.  Repeat CBC tomorrow. His platelet count was normal at 173,000 on 11/16/2019.  Monitor CBC.   CKD stage IIIa Creatinine is stable   History of asthma Continue PRN bronchodilator therapy as well as inhaled steroids   Body mass index is 26.32 kg/m.   Family Communication/Anticipated D/C date and plan/Code Status   DVT prophylaxis: SCDs Code Status: Full code Family Communication: Plan discussed with his wife and son at the bedside Disposition Plan:    Status is: Inpatient  Remains inpatient appropriate because:Inpatient level of care appropriate due to severity of illness   Dispo: The patient is from: Home              Anticipated d/c is to: Home              Anticipated d/c date is: 2 days              Patient currently is not medically stable to d/c.           Subjective:   No acute events overnight.  No headache, dizziness, confusion, nausea, diarrhea or vomiting.  He said he slept well last  night.  Objective:    Vitals:   12/03/19 1100 12/03/19 1952 12/04/19 0402 12/04/19 1133  BP: (!) 145/65 (!) 169/68 (!) 141/57 (!) 157/61  Pulse: 77 74 67 72  Resp:  20 20   Temp: (!) 97.5 F (36.4 C) 98.2 F (36.8 C) 98.2 F (36.8 C) 98.4 F (36.9 C)  TempSrc: Oral Oral Oral Oral  SpO2: 99% 99% 98% 100%  Weight:      Height:       No data found.   Intake/Output Summary (Last 24 hours) at 12/04/2019 1218 Last data filed at 12/04/2019 1013 Gross per 24 hour  Intake 240 ml  Output 775 ml  Net -535 ml   Filed Weights   12/01/19 0337 12/01/19 2300  Weight: 56.2 kg 59.1 kg    Exam:  GEN: No acute distress SKIN: Warm and dry EYES: No pallor or icterus ENT: MMM CV: RRR PULM: No wheezing or rales heard ABD: soft, ND, NT, +BS CNS: AAO x 3, non focal EXT: No edema or tenderness   Data Reviewed:   I have personally reviewed following labs and imaging studies:  Labs: Labs show the following:   Basic Metabolic Panel: Recent Labs  Lab 12/01/19 0339 12/01/19 0339 12/01/19 0544 12/01/19 0544 12/02/19 0514 12/02/19 0514 12/03/19 0448 12/04/19 0439  NA 127*  --  127*  --  126*  --  125* 123*  K 4.0   < > 4.4   < > 3.8   < > 3.7 3.6  CL 92*  --  97*  --  97*  --  94* 93*  CO2 25  --  24  --  23  --  20* 19*  GLUCOSE 116*  --  95  --  80  --  74 73  BUN 20  --  19  --  17  --  14 13  CREATININE 1.11  --  1.02  --  1.07  --  0.94 1.01  CALCIUM 8.4*  --  7.7*  --  7.9*  --  8.0* 8.3*   < > = values in this interval not displayed.   GFR Estimated Creatinine Clearance: 36.7 mL/min (by C-G formula based on SCr of 1.01 mg/dL). Liver Function Tests: Recent Labs  Lab 12/01/19 0339  AST 30  ALT 22  ALKPHOS 60  BILITOT 1.7*  PROT 6.2*  ALBUMIN 4.0   Recent Labs  Lab 12/01/19 0339  LIPASE 23   No results for input(s): AMMONIA in the last 168 hours. Coagulation profile No results for input(s): INR, PROTIME in the last 168 hours.  CBC: Recent Labs  Lab  12/01/19 0341 12/02/19 0514 12/03/19 0448  WBC 7.5 6.5 8.2  NEUTROABS 5.5  --  6.0  HGB 12.3* 10.4* 11.7*  HCT 36.1* 28.8* 33.4*  MCV 98.9 95.4 96.8  PLT 107* 93* 108*   Cardiac Enzymes: No results for input(s): CKTOTAL, CKMB, CKMBINDEX, TROPONINI in the last 168 hours. BNP (last 3 results) No results for input(s): PROBNP in the last 8760 hours. CBG: No results for input(s): GLUCAP in the last 168 hours. D-Dimer: No results for input(s): DDIMER in the last 72 hours. Hgb A1c: No results for input(s): HGBA1C in the last 72 hours. Lipid Profile: Recent Labs    12/04/19 0439  CHOL 152  HDL 67  LDLCALC 70  TRIG 73  CHOLHDL 2.3   Thyroid function studies: Recent Labs    12/02/19 0514  TSH 1.134   Anemia work up: No results for input(s): VITAMINB12, FOLATE, FERRITIN, TIBC, IRON, RETICCTPCT in the last 72 hours. Sepsis Labs: Recent Labs  Lab 12/01/19 0341 12/02/19 0514 12/03/19 0448  WBC 7.5 6.5 8.2    Microbiology Recent Results (from the past 240 hour(s))  SARS Coronavirus 2 by RT PCR (hospital order, performed in [facility] hospital lab) Nasopharyngeal Nasopharyngeal Swab     Status: None   Collection Time: 12/01/19  7:37 AM   Specimen: Nasopharyngeal Swab  Result Value Ref Range Status   SARS Coronavirus 2 NEGATIVE NEGATIVE Final    Comment: (NOTE) SARS-CoV-2 target nucleic acids are NOT DETECTED. The SARS-CoV-2 RNA is generally detectable in upper and lower respiratory specimens during the acute phase of infection. The lowest concentration of SARS-CoV-2 viral copies this assay can detect is 250 copies / mL. A negative result does not preclude SARS-CoV-2 infection and should not be used as the sole basis for treatment or other patient management decisions.  A negative result may occur with improper specimen collection / handling, submission of specimen other than nasopharyngeal swab, presence of viral mutation(s) within the areas targeted by this assay,  and inadequate number of viral copies (<250 copies / mL). A negative result must be combined with clinical observations, patient history, and epidemiological information. Fact Sheet for Patients:   StrictlyIdeas.no Fact Sheet for Healthcare Providers: BankingDealers.co.za This test  is not yet approved or cleared  by the [place] and has been authorized for detection and/or diagnosis of SARS-CoV-2 by FDA under an Emergency Use Authorization (EUA).  This EUA will remain in effect (meaning this test can be used) for the duration of the COVID-19 declaration under Section 564(b)(1) of the Act, 21 U.S.C. section 360bbb-3(b)(1), unless the authorization is terminated or revoked sooner. Performed at [facility], [address] [place], [place] [address]     Procedures and diagnostic studies:  DG Chest [facility] 1 View  Result Date: 12/03/2019 CLINICAL DATA:  Hyponatremia EXAM: PORTABLE CHEST 1 VIEW COMPARISON:  01/26/2011 FINDINGS: The heart size and mediastinal contours are within normal limits. Bo No focal airspace consolidation, pleural effusion, or pneumothorax. The visualized skeletal structures are unremarkable. There is a small amount of residual contrast within the visualized colon. IMPRESSION: No active disease. Electronically Signed   By: Davina Poke D.O.   On: 12/03/2019 11:26    Medications:   . allopurinol  200 mg Oral Daily  . amLODipine  5 mg Oral Daily  . aspirin EC  81 mg Oral Daily  . melatonin  2.5 mg Oral QHS  . mometasone-formoterol  2 puff Inhalation BID  . multivitamin with minerals  1 tablet Oral Daily  . pantoprazole (PROTONIX) IV  40 mg Intravenous Q24H  . tolvaptan  15 mg Oral Q24H   Continuous Infusions:    LOS: 2 days   Mckinzy Fuller  [facility]     12/04/2019, 12:18 PM

## 2019-12-04 NOTE — Progress Notes (Signed)
[facility]  ROUNDING NOTE   Subjective:   [name] admitted to [facility] on 12/01/2019 for Hyponatremia [E87.1] Refractory nausea and vomiting [R11.2] Non-intractable vomiting with nausea, unspecified vomiting type [R11.2]  Patient was found to have a serum sodium of 127 on admission. He was started on IV normal saline which has made patient feel better but his sodium levels did not improve therefore nephrology consult was requested. Started on tolvaptan yesterday but received a dose this morning. Sodium lower at 123 today   Objective:  Vital signs in last 24 hours:  Temp:  [97.5 F (36.4 C)-98.2 F (36.8 C)] 98.2 F (36.8 C) (06/12 0402) Pulse Rate:  [67-77] 67 (06/12 0402) Resp:  [20] 20 (06/12 0402) BP: (141-169)/(57-68) 141/57 (06/12 0402) SpO2:  [98 %-99 %] 98 % (06/12 0402)  Weight change:  Filed Weights   12/01/19 0337 12/01/19 2300  Weight: 56.2 kg 59.1 kg    Intake/Output: I/O last 3 completed shifts: In: 2648.9 [P.O.:120; I.V.:2528.9] Out: 1900 [Urine:1900]   Intake/Output this shift:  Total I/O In: 240 [P.O.:240] Out: 375 [Urine:375]  Physical Exam: General:  No acute distress, laying in the bed  HEENT  anicteric, moist oral mucous membrane  Pulm/lungs  normal breathing effort, lungs are clear to auscultation  CVS/Heart  regular rhythm, no rub or gallop  Abdomen:   Soft, nontender  Extremities:  No peripheral edema  Neurologic:  Alert, oriented, able to follow commands  Skin:  No acute rashes      Basic Metabolic Panel: Recent Labs  Lab 12/01/19 0339 12/01/19 0339 12/01/19 0544 12/01/19 0544 12/02/19 0514 12/03/19 0448 12/04/19 0439  NA 127*  --  127*  --  126* 125* 123*  K 4.0  --  4.4  --  3.8 3.7 3.6  CL 92*  --  97*  --  97* 94* 93*  CO2 25  --  24  --  23 20* 19*  GLUCOSE 116*  --  95  --  80 74 73  BUN 20  --  19  --  17 14 13   CREATININE 1.11  --  1.02  --  1.07 0.94 1.01  CALCIUM 8.4*   < > 7.7*   < > 7.9*  8.0* 8.3*   < > = values in this interval not displayed.    Liver Function Tests: Recent Labs  Lab 12/01/19 0339  AST 30  ALT 22  ALKPHOS 60  BILITOT 1.7*  PROT 6.2*  ALBUMIN 4.0   Recent Labs  Lab 12/01/19 0339  LIPASE 23   No results for input(s): AMMONIA in the last 168 hours.  CBC: Recent Labs  Lab 12/01/19 0341 12/02/19 0514 12/03/19 0448  WBC 7.5 6.5 8.2  NEUTROABS 5.5  --  6.0  HGB 12.3* 10.4* 11.7*  HCT 36.1* 28.8* 33.4*  MCV 98.9 95.4 96.8  PLT 107* 93* 108*    Cardiac Enzymes: No results for input(s): CKTOTAL, CKMB, CKMBINDEX, TROPONINI in the last 168 hours.  BNP: Invalid input(s): POCBNP  CBG: No results for input(s): GLUCAP in the last 168 hours.  Microbiology: Results for orders placed or performed during the hospital encounter of 12/01/19  SARS Coronavirus 2 by RT PCR (hospital order, performed in [facility] hospital lab) Nasopharyngeal Nasopharyngeal Swab     Status: None   Collection Time: 12/01/19  7:37 AM   Specimen: Nasopharyngeal Swab  Result Value Ref Range Status   SARS Coronavirus 2 NEGATIVE NEGATIVE Final  Comment: (NOTE) SARS-CoV-2 target nucleic acids are NOT DETECTED. The SARS-CoV-2 RNA is generally detectable in upper and lower respiratory specimens during the acute phase of infection. The lowest concentration of SARS-CoV-2 viral copies this assay can detect is 250 copies / mL. A negative result does not preclude SARS-CoV-2 infection and should not be used as the sole basis for treatment or other patient management decisions.  A negative result may occur with improper specimen collection / handling, submission of specimen other than nasopharyngeal swab, presence of viral mutation(s) within the areas targeted by this assay, and inadequate number of viral copies (<250 copies / mL). A negative result must be combined with clinical observations, patient history, and epidemiological information. Fact Sheet for Patients:    StrictlyIdeas.no Fact Sheet for Healthcare Providers: BankingDealers.co.za This test is not yet approved or cleared  by the [place] FDA and has been authorized for detection and/or diagnosis of SARS-CoV-2 by FDA under an Emergency Use Authorization (EUA).  This EUA will remain in effect (meaning this test can be used) for the duration of the COVID-19 declaration under Section 564(b)(1) of the Act, 21 U.S.C. section 360bbb-3(b)(1), unless the authorization is terminated or revoked sooner. Performed at [facility], [address] [place],  [address]     Coagulation Studies: No results for input(s): LABPROT, INR in the last 72 hours.  Urinalysis: No results for input(s): COLORURINE, LABSPEC, PHURINE, GLUCOSEU, HGBUR, BILIRUBINUR, KETONESUR, PROTEINUR, UROBILINOGEN, NITRITE, LEUKOCYTESUR in the last 72 hours.  Invalid input(s): APPERANCEUR    Imaging: DG Chest Port 1 View  Result Date: 12/03/2019 CLINICAL DATA:  Hyponatremia EXAM: PORTABLE CHEST 1 VIEW COMPARISON:  01/26/2011 FINDINGS: The heart size and mediastinal contours are within normal limits. Bo No focal airspace consolidation, pleural effusion, or pneumothorax. The visualized skeletal structures are unremarkable. There is a small amount of residual contrast within the visualized colon. IMPRESSION: No active disease. Electronically Signed   By: Davina Poke D.O.   On: 12/03/2019 11:26     Medications:    . allopurinol  200 mg Oral Daily  . amLODipine  5 mg Oral Daily  . aspirin EC  81 mg Oral Daily  . melatonin  2.5 mg Oral QHS  . mometasone-formoterol  2 puff Inhalation BID  . multivitamin with minerals  1 tablet Oral Daily  . pantoprazole (PROTONIX) IV  40 mg Intravenous Q24H  . tolvaptan  15 mg Oral Q24H   acetaminophen **OR** acetaminophen, hydrALAZINE, ipratropium-albuterol, ondansetron **OR** ondansetron (ZOFRAN) IV  Assessment/ Plan:   [name] is a 86 y.o. white male with hypertension, GERD, COPD, hyperlipidemia, pancreatitis who is admitted to [facility] on 12/01/2019 for Hyponatremia [E87.1] Refractory nausea and vomiting [R11.2] Non-intractable vomiting with nausea, unspecified vomiting type [R11.2]  1. Hyponatremia: hypovolemic on admission. Euvolemic on examination today.  - Hold IV fluids - Tolvaptan today (given 6/12) 1st dose  2. Chronic kidney disease stage IIIA: creatinine and GFR are better than baseline. Creatinine was 1.4, GFR of 48 on 08/25/19. Not currently taking an ACE-I/ARB.   3. Hypertension:  No edema on examination.  - restarted amlodipine.   4. Gout: no acute gout flares.    LOS: 2 Dee Maday 6/12/202110:30 AM

## 2019-12-05 LAB — CBC WITH DIFFERENTIAL/PLATELET
Abs Immature Granulocytes: 0.03 10*3/uL (ref 0.00–0.07)
Basophils Absolute: 0 10*3/uL (ref 0.0–0.1)
Basophils Relative: 1 %
Eosinophils Absolute: 0.6 10*3/uL — ABNORMAL HIGH (ref 0.0–0.5)
Eosinophils Relative: 9 %
HCT: 31.7 % — ABNORMAL LOW (ref 39.0–52.0)
Hemoglobin: 11.7 g/dL — ABNORMAL LOW (ref 13.0–17.0)
Immature Granulocytes: 1 %
Lymphocytes Relative: 10 %
Lymphs Abs: 0.7 10*3/uL (ref 0.7–4.0)
MCH: 34.4 pg — ABNORMAL HIGH (ref 26.0–34.0)
MCHC: 36.9 g/dL — ABNORMAL HIGH (ref 30.0–36.0)
MCV: 93.2 fL (ref 80.0–100.0)
Monocytes Absolute: 0.8 10*3/uL (ref 0.1–1.0)
Monocytes Relative: 12 %
Neutro Abs: 4.4 10*3/uL (ref 1.7–7.7)
Neutrophils Relative %: 67 %
Platelets: 125 10*3/uL — ABNORMAL LOW (ref 150–400)
RBC: 3.4 MIL/uL — ABNORMAL LOW (ref 4.22–5.81)
RDW: 12.6 % (ref 11.5–15.5)
WBC: 6.4 10*3/uL (ref 4.0–10.5)
nRBC: 0 % (ref 0.0–0.2)

## 2019-12-05 LAB — SODIUM: Sodium: 137 mmol/L (ref 135–145)

## 2019-12-05 MED ORDER — AMLODIPINE BESYLATE 5 MG PO TABS: 5.0000 mg | ORAL_TABLET | Freq: Every day | ORAL | 0 refills | Status: DC

## 2019-12-05 NOTE — Progress Notes (Signed)
[facility]  ROUNDING NOTE   Subjective:   Colin Decker admitted to [facility] on 12/01/2019 for Hyponatremia [E87.1] Refractory nausea and vomiting [R11.2] Non-intractable vomiting with nausea, unspecified vomiting type [R11.2]  Patient was found to have a serum sodium of 127 on admission. He was started on IV normal saline which has made patient feel better but his sodium levels did not improve therefore nephrology consult was requested. Patient has responded well to tolvaptan and sodium level has improved to 137 today.  He states he feels well.  Was able to eat breakfast without nausea or vomiting He feels ready for discharge to home Wife at bedside   Objective:  Vital signs in last 24 hours:  Temp:  [98 F (36.7 C)-98.4 F (36.9 C)] 98 F (36.7 C) (06/13 0504) Pulse Rate:  [72-81] 79 (06/13 0504) Resp:  [20] 20 (06/13 0504) BP: (149-169)/(61-70) 149/62 (06/13 0504) SpO2:  [98 %-100 %] 98 % (06/13 0504)  Weight change:  Filed Weights   12/01/19 0337 12/01/19 2300  Weight: 56.2 kg 59.1 kg    Intake/Output: I/O last 3 completed shifts: In: 1613 [P.O.:1613] Out: 4315 [Urine:1625]   Intake/Output this shift:  No intake/output data recorded.  Physical Exam: General:  No acute distress, laying in the bed  HEENT  anicteric, moist oral mucous membrane  Pulm/lungs  normal breathing effort, lungs are clear to auscultation  CVS/Heart  regular rhythm, no rub or gallop  Abdomen:   Soft, nontender  Extremities:  No peripheral edema  Neurologic:  Alert, oriented, able to follow commands  Skin:  No acute rashes      Basic Metabolic Panel: Recent Labs  Lab 12/01/19 0339 12/01/19 0339 12/01/19 0544 12/01/19 0544 12/02/19 0514 12/03/19 0448 12/04/19 0439 12/04/19 1603 12/05/19 0637  NA 127*   < > 127*   < > 126* 125* 123* 126* 137  K 4.0  --  4.4  --  3.8 3.7 3.6  --   --   CL 92*  --  97*  --  97* 94* 93*  --   --   CO2 25  --  24  --  23 20* 19*   --   --   GLUCOSE 116*  --  95  --  80 74 73  --   --   BUN 20  --  19  --  17 14 13   --   --   CREATININE 1.11  --  1.02  --  1.07 0.94 1.01  --   --   CALCIUM 8.4*   < > 7.7*   < > 7.9* 8.0* 8.3*  --   --    < > = values in this interval not displayed.    Liver Function Tests: Recent Labs  Lab 12/01/19 0339  AST 30  ALT 22  ALKPHOS 60  BILITOT 1.7*  PROT 6.2*  ALBUMIN 4.0   Recent Labs  Lab 12/01/19 0339  LIPASE 23   No results for input(s): AMMONIA in the last 168 hours.  CBC: Recent Labs  Lab 12/01/19 0341 12/02/19 0514 12/03/19 0448 12/05/19 0637  WBC 7.5 6.5 8.2 6.4  NEUTROABS 5.5  --  6.0 4.4  HGB 12.3* 10.4* 11.7* 11.7*  HCT 36.1* 28.8* 33.4* 31.7*  MCV 98.9 95.4 96.8 93.2  PLT 107* 93* 108* 125*    Cardiac Enzymes: No results for input(s): CKTOTAL, CKMB, CKMBINDEX, TROPONINI in the last 168 hours.  BNP: Invalid input(s): POCBNP  CBG: No results for input(s): GLUCAP in the last 168 hours.  Microbiology: Results for orders placed or performed during the hospital encounter of 12/01/19  SARS Coronavirus 2 by RT PCR (hospital order, performed in [facility] hospital lab) Nasopharyngeal Nasopharyngeal Swab     Status: None   Collection Time: 12/01/19  7:37 AM   Specimen: Nasopharyngeal Swab  Result Value Ref Range Status   SARS Coronavirus 2 NEGATIVE NEGATIVE Final    Comment: (NOTE) SARS-CoV-2 target nucleic acids are NOT DETECTED. The SARS-CoV-2 RNA is generally detectable in upper and lower respiratory specimens during the acute phase of infection. The lowest concentration of SARS-CoV-2 viral copies this assay can detect is 250 copies / mL. A negative result does not preclude SARS-CoV-2 infection and should not be used as the sole basis for treatment or other patient management decisions.  A negative result may occur with improper specimen collection / handling, submission of specimen other than nasopharyngeal swab, presence of viral mutation(s)  within the areas targeted by this assay, and inadequate number of viral copies (<250 copies / mL). A negative result must be combined with clinical observations, patient history, and epidemiological information. Fact Sheet for Patients:   StrictlyIdeas.no Fact Sheet for Healthcare Providers: BankingDealers.co.za This test is not yet approved or cleared  by the [place] FDA and has been authorized for detection and/or diagnosis of SARS-CoV-2 by FDA under an Emergency Use Authorization (EUA).  This EUA will remain in effect (meaning this test can be used) for the duration of the COVID-19 declaration under Section 564(b)(1) of the Act, 21 U.S.C. section 360bbb-3(b)(1), unless the authorization is terminated or revoked sooner. Performed at [facility], [address] [place], [place] [address]     Coagulation Studies: No results for input(s): LABPROT, INR in the last 72 hours.  Urinalysis: No results for input(s): COLORURINE, LABSPEC, PHURINE, GLUCOSEU, HGBUR, BILIRUBINUR, KETONESUR, PROTEINUR, UROBILINOGEN, NITRITE, LEUKOCYTESUR in the last 72 hours.  Invalid input(s): APPERANCEUR    Imaging: No results found.   Medications:    . allopurinol  200 mg Oral Daily  . amLODipine  5 mg Oral Daily  . aspirin EC  81 mg Oral Daily  . melatonin  2.5 mg Oral QHS  . mometasone-formoterol  2 puff Inhalation BID  . multivitamin with minerals  1 tablet Oral Daily  . pantoprazole (PROTONIX) IV  40 mg Intravenous Q24H   acetaminophen **OR** acetaminophen, hydrALAZINE, ipratropium-albuterol, ondansetron **OR** ondansetron (ZOFRAN) IV  Assessment/ Plan:  [name] is a 86 y.o. white male with hypertension, GERD, COPD, hyperlipidemia, pancreatitis who is admitted to [facility] on 12/01/2019 for Hyponatremia [E87.1] Refractory nausea and vomiting [R11.2] Non-intractable vomiting with nausea, unspecified vomiting type  [R11.2]  1. Hyponatremia: hypovolemic on admission. Euvolemic on examination today.  -Serum sodium improved significantly with 1 dose of tolvaptan -Ready for discharge from renal standpoint -We will follow-up as outpatient in a couple of weeks.  2. Chronic kidney disease stage 2 New creatinine baseline of 0.94, GFR greater than 60  3. Hypertension:  No edema on examination.  - restarted amlodipine.   4. Gout: no acute gout flares.    LOS: 3 Maico Mulvehill 6/13/202110:01 AM

## 2019-12-05 NOTE — TOC Transition Note (Addendum)
Transition of Care [facility]) - CM/SW Discharge Note   Patient Details  Name: Colin Decker MRN: [id number] Date of Birth: 07/13/1927  [facility]) CM/SW Contact:  Meriel Flavors, LCSW Phone Number: 12/05/2019, 10:14 AM   Clinical Narrative:    6/13: CSW Just spoke with Patient's wife to make sure they plan to transport Pt back to his apartment and wouldn't need any help form TOC. Rod Holler stated she has it taken care of. Doctor just notified me he is putting in HH/PT, I will set up and call wife. TOC signing off           Patient Goals and CMS Choice        Discharge Placement                       Discharge Plan and Services                                     Social Determinants of Health (SDOH) Interventions     Readmission Risk Interventions No flowsheet data found.

## 2019-12-05 NOTE — Plan of Care (Signed)
Discharge teaching completed with patient and wife who verbalized understanding of teaching.  Discharged in stable condition and with all belongings.

## 2019-12-05 NOTE — Plan of Care (Signed)
Continuing with plan of care. 

## 2019-12-05 NOTE — Discharge Summary (Addendum)
Physician Discharge Summary  Colin Decker:[id number] DOB: Sep 24, 1927 DOA: 12/01/2019  PCP: Colin Hartigan, MD  Admit date: 12/01/2019 Discharge date: 12/05/2019  Discharge disposition: Home with home health care   Recommendations for Outpatient Follow-Up:   Outpatient follow-up with PCP in 1 week   Discharge Diagnosis:   Principal Problem:   Refractory nausea and vomiting Active Problems:   Hyponatremia   Acute dehydration   Essential hypertension   COPD with chronic bronchitis ([place])    Discharge Condition: Stable.  Diet recommendation: Heart healthy diet  Code status: Full code.    Hospital Course:   [name] is a 86 y.o. male with medical history significant for asthma, GERD, hypertension and dyslipidemia who presented to the ER for evaluation of nausea and vomiting.  Patient was sent to the hospital by his primary care provider to get a CAT scan of the abdomen and pelvis for evaluation of change in bowel habits, constipation and decreased stool caliber.  Later that evening patient developed nausea and vomiting and had multiple episodes of nonbilious, nonbloody emesis.   Work-up revealed acute hyponatremia and dehydration.  He was treated with IV fluids and antiemetics.  Nausea and vomiting resolved but hyponatremia worsened.  Sodium went from 127 on admission to 123 despite treatment with IV fluids.  IV fluids was discontinued.  Nephrologist was consulted and patient was given 1 dose of tolvaptan.  Sodium level improved to 137.  Amlodipine was increased from 2.5 to 5 mg daily for adequate BP control.  He also had thrombocytopenia on admission but this is improving and platelet count has gone from 107,000-125,000.  Patient probably has underlying CKD although it is difficult to classify or stage his CKD because of fluctuating GFR.  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 All his symptoms have resolved.  He was evaluated by PT who recommended home health  therapy because of unsteady gait.  On the day of discharge, I called his wife on her phone but there was no response so I left a voice message.     Discharge Exam:   Vitals:   12/04/19 2145 12/05/19 0504  BP: (!) 157/61 (!) 149/62  Pulse:  79  Resp:  20  Temp:  98 F (36.7 C)  SpO2:  98%   Vitals:   12/04/19 1133 12/04/19 2011 12/04/19 2145 12/05/19 0504  BP: (!) 157/61 (!) 169/70 (!) 157/61 (!) 149/62  Pulse: 72 81  79  Resp:  20  20  Temp: 98.4 F (36.9 C) 98 F (36.7 C)  98 F (36.7 C)  TempSrc: Oral Oral  Oral  SpO2: 100% 100%  98%  Weight:      Height:         GEN: NAD SKIN: No rash EYES: EOMI ENT: MMM CV: RRR PULM: CTA B ABD: soft, ND, NT, +BS CNS: AAO x 3, non focal EXT: No edema or tenderness   The results of significant diagnostics from this hospitalization (including imaging, microbiology, ancillary and laboratory) are listed below for reference.     Procedures and Diagnostic Studies:   No results found.   Labs:   Basic Metabolic Panel: Recent Labs  Lab 12/01/19 0339 12/01/19 0339 12/01/19 0544 12/01/19 0544 12/02/19 0514 12/02/19 0514 12/03/19 0448 12/04/19 0439 12/04/19 1603 12/05/19 0637  NA 127*   < > 127*   < > 126*  --  125* 123* 126* 137  K 4.0   < > 4.4   < >  3.8   < > 3.7 3.6  --   --   CL 92*  --  97*  --  97*  --  94* 93*  --   --   CO2 25  --  24  --  23  --  20* 19*  --   --   GLUCOSE 116*  --  95  --  80  --  74 73  --   --   BUN 20  --  19  --  17  --  14 13  --   --   CREATININE 1.11  --  1.02  --  1.07  --  0.94 1.01  --   --   CALCIUM 8.4*  --  7.7*  --  7.9*  --  8.0* 8.3*  --   --    < > = values in this interval not displayed.   GFR Estimated Creatinine Clearance: 36.7 mL/min (by C-G formula based on SCr of 1.01 mg/dL). Liver Function Tests: Recent Labs  Lab 12/01/19 0339  AST 30  ALT 22  ALKPHOS 60  BILITOT 1.7*  PROT 6.2*  ALBUMIN 4.0   Recent Labs  Lab 12/01/19 0339  LIPASE 23   No results  for input(s): AMMONIA in the last 168 hours. Coagulation profile No results for input(s): INR, PROTIME in the last 168 hours.  CBC: Recent Labs  Lab 12/01/19 0341 12/02/19 0514 12/03/19 0448 12/05/19 0637  WBC 7.5 6.5 8.2 6.4  NEUTROABS 5.5  --  6.0 4.4  HGB 12.3* 10.4* 11.7* 11.7*  HCT 36.1* 28.8* 33.4* 31.7*  MCV 98.9 95.4 96.8 93.2  PLT 107* 93* 108* 125*   Cardiac Enzymes: No results for input(s): CKTOTAL, CKMB, CKMBINDEX, TROPONINI in the last 168 hours. BNP: Invalid input(s): POCBNP CBG: No results for input(s): GLUCAP in the last 168 hours. D-Dimer No results for input(s): DDIMER in the last 72 hours. Hgb A1c No results for input(s): HGBA1C in the last 72 hours. Lipid Profile Recent Labs    12/04/19 0439  CHOL 152  HDL 67  LDLCALC 70  TRIG 73  CHOLHDL 2.3   Thyroid function studies No results for input(s): TSH, T4TOTAL, T3FREE, THYROIDAB in the last 72 hours.  Invalid input(s): FREET3 Anemia work up No results for input(s): VITAMINB12, FOLATE, FERRITIN, TIBC, IRON, RETICCTPCT in the last 72 hours. Microbiology Recent Results (from the past 240 hour(s))  SARS Coronavirus 2 by RT PCR (hospital order, performed in [facility] hospital lab) Nasopharyngeal Nasopharyngeal Swab     Status: None   Collection Time: 12/01/19  7:37 AM   Specimen: Nasopharyngeal Swab  Result Value Ref Range Status   SARS Coronavirus 2 NEGATIVE NEGATIVE Final    Comment: (NOTE) SARS-CoV-2 target nucleic acids are NOT DETECTED. The SARS-CoV-2 RNA is generally detectable in upper and lower respiratory specimens during the acute phase of infection. The lowest concentration of SARS-CoV-2 viral copies this assay can detect is 250 copies / mL. A negative result does not preclude SARS-CoV-2 infection and should not be used as the sole basis for treatment or other patient management decisions.  A negative result may occur with improper specimen collection / handling, submission of  specimen other than nasopharyngeal swab, presence of viral mutation(s) within the areas targeted by this assay, and inadequate number of viral copies (<250 copies / mL). A negative result must be combined with clinical observations, patient history, and epidemiological information. Fact Sheet for Patients:   StrictlyIdeas.no Fact  Sheet for Healthcare Providers: BankingDealers.co.za This test is not yet approved or cleared  by the [place] and has been authorized for detection and/or diagnosis of SARS-CoV-2 by FDA under an Emergency Use Authorization (EUA).  This EUA will remain in effect (meaning this test can be used) for the duration of the COVID-19 declaration under Section 564(b)(1) of the Act, 21 U.S.C. section 360bbb-3(b)(1), unless the authorization is terminated or revoked sooner. Performed at [facility], [address] [place], [place] [address]      Discharge Instructions:   Discharge Instructions    Diet - low sodium heart healthy   Complete by: As directed    Face-to-face encounter (required for Medicare/Medicaid patients)   Complete by: As directed    I [place] certify that this patient is under my care and that I, or a nurse practitioner or physician's assistant working with me, had a face-to-face encounter that meets the physician face-to-face encounter requirements with this patient on 12/05/2019. The encounter with the patient was in whole, or in part for the following medical condition(s) which is the primary reason for home health care (List medical condition): Unsteady gait   The encounter with the patient was in whole, or in part, for the following medical condition, which is the primary reason for home health care: Unsteady gait   I certify that, based on my findings, the following services are medically necessary home health services: Physical therapy   Reason for Medically Necessary Home  Health Services: Therapy- Personnel officer, Public librarian   My clinical findings support the need for the above services: Unable to leave home safely without assistance and/or assistive device   Further, I certify that my clinical findings support that this patient is homebound due to: Unable to leave home safely without assistance   Home Health   Complete by: As directed    To provide the following care/treatments: PT   Increase activity slowly   Complete by: As directed      Allergies as of 12/05/2019      Reactions   Biaxin [clarithromycin] Nausea Only   Codeine Sulfate Nausea Only   Erythromycin Nausea Only   Naprosyn [naproxen] Nausea Only   Augmentin [amoxicillin-pot Clavulanate] Rash      Medication List    STOP taking these medications   erythromycin ophthalmic ointment     TAKE these medications   albuterol 108 (90 Base) MCG/ACT inhaler Commonly known as: VENTOLIN HFA Inhale 1-2 puffs into the lungs every 4 (four) hours as needed for wheezing or shortness of breath.   allopurinol 100 MG tablet Commonly known as: ZYLOPRIM Take 200 mg by mouth daily.   amLODipine 5 MG tablet Commonly known as: NORVASC Take 1 tablet (5 mg total) by mouth daily. What changed:   medication strength  how much to take   aspirin 81 MG tablet Take 81 mg by mouth daily.   Combivent Respimat 20-100 MCG/ACT Aers respimat Generic drug: Ipratropium-Albuterol Inhale 1 puff into the lungs every 6 (six) hours.   famotidine 20 MG tablet Commonly known as: PEPCID Take 20 mg by mouth daily.   Fluticasone-Salmeterol 250-50 MCG/DOSE Aepb Commonly known as: ADVAIR Inhale 1 puff into the lungs 2 (two) times daily.   Multi-Vitamin tablet Take 1 tablet by mouth.   Pramoxine-Benzalkonium Cl 1-0.13 % Liqd Apply topically.         Time coordinating discharge: 28 minutes  Signed:  Marquis Diles  [facility] 12/05/2019,  10:12 AM

## 2019-12-20 ENCOUNTER — Other Ambulatory Visit
Admission: RE | Admit: 2019-12-20 | Discharge: 2019-12-20 | Disposition: A | Discharge status: Home / Self Care | Payer: Medicare Other | Source: Ambulatory Visit | Attending: Student | Admitting: Student

## 2019-12-20 DIAGNOSIS — R197 Diarrhea, unspecified: Secondary | ICD-10-CM | POA: Insufficient documentation

## 2019-12-20 LAB — GASTROINTESTINAL PANEL BY PCR, STOOL (REPLACES STOOL CULTURE)

## 2019-12-20 LAB — C DIFFICILE QUICK SCREEN W PCR REFLEX
C Diff antigen: NEGATIVE — AB
C Diff interpretation: NOT DETECTED
C Diff toxin: NEGATIVE — AB

## 2019-12-22 LAB — PANCREATIC ELASTASE, FECAL

## 2019-12-22 LAB — CALPROTECTIN, FECAL: Calprotectin, Fecal: 2380 ug/g — ABNORMAL HIGH (ref 0–120)

## 2019-12-25 ENCOUNTER — Other Ambulatory Visit: Payer: Self-pay

## 2019-12-25 DIAGNOSIS — Z5321 Procedure and treatment not carried out due to patient leaving prior to being seen by health care provider: Secondary | ICD-10-CM | POA: Diagnosis not present

## 2019-12-25 DIAGNOSIS — R109 Unspecified abdominal pain: Secondary | ICD-10-CM | POA: Insufficient documentation

## 2019-12-25 DIAGNOSIS — R197 Diarrhea, unspecified: Secondary | ICD-10-CM | POA: Diagnosis not present

## 2019-12-25 LAB — COMPREHENSIVE METABOLIC PANEL
ALT: 15 U/L (ref 0–44)
AST: 25 U/L (ref 15–41)
Albumin: 4 g/dL (ref 3.5–5.0)
Alkaline Phosphatase: 61 U/L (ref 38–126)
Anion gap: 11 (ref 5–15)
BUN: 30 mg/dL — ABNORMAL HIGH (ref 8–23)
CO2: 24 mmol/L (ref 22–32)
Calcium: 9.1 mg/dL (ref 8.9–10.3)
Chloride: 100 mmol/L (ref 98–111)
Creatinine, Ser: 1.93 mg/dL — ABNORMAL HIGH (ref 0.61–1.24)
GFR calc Af Amer: 35 mL/min — ABNORMAL LOW (ref >60)
GFR calc non Af Amer: 30 mL/min — ABNORMAL LOW (ref >60)
Glucose, Bld: 122 mg/dL — ABNORMAL HIGH (ref 70–99)
Potassium: 3 mmol/L — ABNORMAL LOW (ref 3.5–5.1)
Sodium: 135 mmol/L (ref 135–145)
Total Bilirubin: 0.8 mg/dL (ref 0.3–1.2)
Total Protein: 6.7 g/dL (ref 6.5–8.1)

## 2019-12-25 LAB — CBC
HCT: 32.8 % — ABNORMAL LOW (ref 39.0–52.0)
Hemoglobin: 11.9 g/dL — ABNORMAL LOW (ref 13.0–17.0)
MCH: 33.3 pg (ref 26.0–34.0)
MCHC: 36.3 g/dL — ABNORMAL HIGH (ref 30.0–36.0)
MCV: 91.9 fL (ref 80.0–100.0)
Platelets: 203 10*3/uL (ref 150–400)
RBC: 3.57 MIL/uL — ABNORMAL LOW (ref 4.22–5.81)
RDW: 12.6 % (ref 11.5–15.5)
WBC: 5.1 10*3/uL (ref 4.0–10.5)
nRBC: 0 % (ref 0.0–0.2)

## 2019-12-25 LAB — URINALYSIS, COMPLETE (UACMP) WITH MICROSCOPIC
Bacteria, UA: NONE SEEN
Bilirubin Urine: NEGATIVE
Glucose, UA: NEGATIVE mg/dL
Hgb urine dipstick: NEGATIVE
Ketones, ur: 5 mg/dL — AB
Leukocytes,Ua: NEGATIVE
Nitrite: NEGATIVE
Protein, ur: 30 mg/dL — AB
Specific Gravity, Urine: 1.018 (ref 1.005–1.030)
pH: 5 (ref 5.0–8.0)

## 2019-12-25 LAB — LIPASE, BLOOD: Lipase: 17 U/L (ref 11–51)

## 2019-12-25 NOTE — ED Triage Notes (Addendum)
Pt arrived via POV with reports of several weeks of diarrhea, left side abdominal pain and poor appetite. Pt seen here on 6/9 for similar sxs, states he has not had much of a change. Denies and N/V. Pt states all of his stools are watery.  Pt states he is scheduled for a colonscopy. Pt had CT scan last time he was here.  Pt is alert and oriented at this time.  Pt lives at the [place].

## 2019-12-26 ENCOUNTER — Emergency Department
Admission: EM | Admit: 2019-12-26 | Discharge: 2019-12-26 | Disposition: A | Discharge status: Home / Self Care | Payer: Medicare Other | Attending: Emergency Medicine | Admitting: Emergency Medicine

## 2019-12-26 NOTE — ED Notes (Signed)
Pt given warm blanket, updated on wait time, wife and son also came into the lobby to check on patient.  Pt stated he wanted to go home, advised him that he should stay to be evaluated. Family upset because they cannot be with him in the lobby, advised them that 1 family member could go back when he is in a room.  Son Marden Noble [phone] wants to go back

## 2019-12-26 NOTE — ED Notes (Signed)
Pt and son to registration desk, pt states he did not want to wait any longer and would return tomorrow if necessary, advised patient that there was one patient in front of him and that I did not know how much longer it would be. Pt ambulatory without difficulty. Advised patient to return as needed and to call 9-1-1 if sxs worsened. Pt and son verbalized understanding.

## 2019-12-30 ENCOUNTER — Other Ambulatory Visit: Payer: Self-pay

## 2019-12-30 ENCOUNTER — Other Ambulatory Visit
Admission: RE | Admit: 2019-12-30 | Discharge: 2019-12-30 | Disposition: A | Discharge status: Home / Self Care | Payer: Medicare Other | Source: Ambulatory Visit | Attending: Gastroenterology | Admitting: Gastroenterology

## 2019-12-30 DIAGNOSIS — Z20822 Contact with and (suspected) exposure to covid-19: Secondary | ICD-10-CM | POA: Diagnosis not present

## 2019-12-30 DIAGNOSIS — Z01812 Encounter for preprocedural laboratory examination: Secondary | ICD-10-CM | POA: Diagnosis present

## 2019-12-30 LAB — SARS CORONAVIRUS 2 (TAT 6-24 HRS): SARS Coronavirus 2: NEGATIVE

## 2019-12-31 ENCOUNTER — Encounter: Payer: Self-pay | Admitting: *Deleted

## 2020-01-03 ENCOUNTER — Ambulatory Visit: Payer: Medicare Other | Admitting: Certified Registered"

## 2020-01-03 ENCOUNTER — Encounter: Payer: Self-pay | Admitting: *Deleted

## 2020-01-03 ENCOUNTER — Ambulatory Visit
Admission: RE | Admit: 2020-01-03 | Discharge: 2020-01-03 | Disposition: A | Discharge status: Home / Self Care | Payer: Medicare Other | Attending: Gastroenterology | Admitting: Gastroenterology

## 2020-01-03 ENCOUNTER — Encounter
Admission: RE | Disposition: A | Discharge status: Home / Self Care | Payer: Self-pay | Source: Home / Self Care | Attending: Gastroenterology

## 2020-01-03 DIAGNOSIS — K648 Other hemorrhoids: Secondary | ICD-10-CM | POA: Insufficient documentation

## 2020-01-03 DIAGNOSIS — Z7951 Long term (current) use of inhaled steroids: Secondary | ICD-10-CM | POA: Insufficient documentation

## 2020-01-03 DIAGNOSIS — K219 Gastro-esophageal reflux disease without esophagitis: Secondary | ICD-10-CM | POA: Insufficient documentation

## 2020-01-03 DIAGNOSIS — K6289 Other specified diseases of anus and rectum: Secondary | ICD-10-CM | POA: Insufficient documentation

## 2020-01-03 DIAGNOSIS — Z885 Allergy status to narcotic agent status: Secondary | ICD-10-CM | POA: Diagnosis not present

## 2020-01-03 DIAGNOSIS — D124 Benign neoplasm of descending colon: Secondary | ICD-10-CM | POA: Diagnosis not present

## 2020-01-03 DIAGNOSIS — J45909 Unspecified asthma, uncomplicated: Secondary | ICD-10-CM | POA: Diagnosis not present

## 2020-01-03 DIAGNOSIS — I10 Essential (primary) hypertension: Secondary | ICD-10-CM | POA: Insufficient documentation

## 2020-01-03 DIAGNOSIS — Z886 Allergy status to analgesic agent status: Secondary | ICD-10-CM | POA: Insufficient documentation

## 2020-01-03 DIAGNOSIS — Z7982 Long term (current) use of aspirin: Secondary | ICD-10-CM | POA: Diagnosis not present

## 2020-01-03 DIAGNOSIS — Z881 Allergy status to other antibiotic agents status: Secondary | ICD-10-CM | POA: Diagnosis not present

## 2020-01-03 DIAGNOSIS — R197 Diarrhea, unspecified: Secondary | ICD-10-CM | POA: Diagnosis present

## 2020-01-03 DIAGNOSIS — Z880 Allergy status to penicillin: Secondary | ICD-10-CM | POA: Insufficient documentation

## 2020-01-03 DIAGNOSIS — Z79899 Other long term (current) drug therapy: Secondary | ICD-10-CM | POA: Insufficient documentation

## 2020-01-03 DIAGNOSIS — K529 Noninfective gastroenteritis and colitis, unspecified: Secondary | ICD-10-CM | POA: Insufficient documentation

## 2020-01-03 HISTORY — DX: Other seasonal allergic rhinitis: J30.2

## 2020-01-03 HISTORY — DX: Diverticulitis of intestine, part unspecified, without perforation or abscess without bleeding: K57.92

## 2020-01-03 HISTORY — DX: Unspecified hearing loss, unspecified ear: H91.90

## 2020-01-03 HISTORY — PX: COLONOSCOPY WITH PROPOFOL: SHX5780

## 2020-01-03 HISTORY — DX: Carpal tunnel syndrome, unspecified upper limb: G56.00

## 2020-01-03 SURGERY — COLONOSCOPY WITH PROPOFOL
Anesthesia: General

## 2020-01-03 MED ORDER — SODIUM CHLORIDE 0.9 % IV SOLN
INTRAVENOUS | Status: DC
  Administered 2020-01-03: 20 mL/h via INTRAVENOUS

## 2020-01-03 MED ORDER — PROPOFOL 10 MG/ML IV BOLUS
INTRAVENOUS | Status: AC
  Filled 2020-01-03: qty 20

## 2020-01-03 MED ORDER — LIDOCAINE HCL (CARDIAC) PF 100 MG/5ML IV SOSY
PREFILLED_SYRINGE | INTRAVENOUS | Status: DC | PRN
  Administered 2020-01-03: 50 mg via INTRAVENOUS

## 2020-01-03 MED ORDER — PROPOFOL 500 MG/50ML IV EMUL
INTRAVENOUS | Status: DC | PRN
  Administered 2020-01-03: 80 ug/kg/min via INTRAVENOUS

## 2020-01-03 MED ORDER — LIDOCAINE HCL (PF) 2 % IJ SOLN
INTRAMUSCULAR | Status: AC
  Filled 2020-01-03: qty 5

## 2020-01-03 MED ORDER — PROPOFOL 500 MG/50ML IV EMUL
INTRAVENOUS | Status: AC
  Filled 2020-01-03: qty 50

## 2020-01-03 NOTE — Op Note (Addendum)
[facility] Patient Name: Colin Decker Procedure Date: 01/03/2020 10:39 AM MRN: [id number] Account #: [id number] Date of Birth: 10/01/1927 Admit Type: Outpatient Age: 86 Room: [facility] ENDO ROOM 1 Gender: Male Note Status: Finalized Procedure:             Colonoscopy Indications:           Clinically significant diarrhea of unexplained origin Providers:             Andrey Farmer MD, MD Medicines:             Monitored Anesthesia Care Complications:         No immediate complications. Estimated blood loss:                         Minimal. Procedure:             Pre-Anesthesia Assessment:                        - Prior to the procedure, a History and Physical was                         performed, and patient medications and allergies were                         reviewed. The patient is competent. The risks and                         benefits of the procedure and the sedation options and                         risks were discussed with the patient. All questions                         were answered and informed consent was obtained.                         Patient identification and proposed procedure were                         verified by the physician, the nurse, the anesthetist                         and the technician in the endoscopy suite. Mental                         Status Examination: alert and oriented. Airway                         Examination: normal oropharyngeal airway and neck                         mobility. Respiratory Examination: clear to                         auscultation. CV Examination: normal. Prophylactic                         Antibiotics: The patient does not require prophylactic  antibiotics. Prior Anticoagulants: The patient has                         taken no previous anticoagulant or antiplatelet                         agents. ASA Grade Assessment: II - A patient with mild                          systemic disease. After reviewing the risks and                         benefits, the patient was deemed in satisfactory                         condition to undergo the procedure. The anesthesia                         plan was to use monitored anesthesia care (MAC).                         Immediately prior to administration of medications,                         the patient was re-assessed for adequacy to receive                         sedatives. The heart rate, respiratory rate, oxygen                         saturations, blood pressure, adequacy of pulmonary                         ventilation, and response to care were monitored                         throughout the procedure. The physical status of the                         patient was re-assessed after the procedure.                        After obtaining informed consent, the colonoscope was                         passed under direct vision. Throughout the procedure,                         the patient's blood pressure, pulse, and oxygen                         saturations were monitored continuously. The                         Colonoscope was introduced through the anus and                         advanced to the the cecum, identified by appendiceal  orifice and ileocecal valve. The colonoscopy was                         performed without difficulty. The patient tolerated                         the procedure well. The quality of the bowel                         preparation was adequate to identify polyps. Findings:      The perianal and digital rectal examinations were normal.      A 3 mm polyp was found in the descending colon. The polyp was sessile.       The polyp was removed with a cold snare. Resection and retrieval were       complete. Estimated blood loss was minimal.      An area of mildly congested mucosa was found in the recto-sigmoid colon.       Biopsies were taken  with a cold forceps for histology. Estimated blood       loss was minimal.      Non-bleeding internal hemorrhoids were found during retroflexion. The       hemorrhoids were small.      The exam was otherwise normal throughout the examined colon.      Biopsies for histology were taken with a cold forceps from the entire       colon for evaluation of microscopic colitis. Impression:            - One 3 mm polyp in the descending colon, removed with                         a cold snare. Resected and retrieved.                        - Congested mucosa in the recto-sigmoid colon.                         Biopsied.                        - Non-bleeding internal hemorrhoids.                        - Biopsies were taken with a cold forceps from the                         entire colon for evaluation of microscopic colitis. Recommendation:        - Discharge patient to home.                        - Resume previous diet.                        - Continue present medications.                        - Await pathology results.                        - Repeat colonoscopy is not recommended due to current  age (1 years or older).                        - Return to referring physician as previously                         scheduled. Procedure Code(s):     --- Professional ---                        [phone], Colonoscopy, flexible; with removal of                         tumor(s), polyp(s), or other lesion(s) by snare                         technique                        45380, 93, Colonoscopy, flexible; with biopsy, single                         or multiple Diagnosis Code(s):     --- Professional ---                        K63.5, Polyp of colon                        K63.89, Other specified diseases of intestine                        K64.8, Other hemorrhoids                        R19.7, Diarrhea, unspecified CPT copyright 2019 American Medical Association. All rights  reserved. The codes documented in this report are preliminary and upon coder review may  be revised to meet current compliance requirements. Andrey Farmer, MD Andrey Farmer MD, MD 01/03/2020 11:19:24 AM Number of Addenda: 0 Note Initiated On: 01/03/2020 10:39 AM Scope Withdrawal Time: 0 hours 12 minutes 1 second  Total Procedure Duration: 0 hours 17 minutes 27 seconds  Estimated Blood Loss:  Estimated blood loss was minimal.      [facility]

## 2020-01-03 NOTE — Anesthesia Preprocedure Evaluation (Signed)
Anesthesia Evaluation  Patient identified by MRN, date of birth, ID band Patient awake    Reviewed: Allergy & Precautions, H&P , NPO status , Patient's Chart, lab work & pertinent test results, reviewed documented beta blocker date and time   History of Anesthesia Complications Negative for: history of anesthetic complications  Airway Mallampati: III  TM Distance: >3 FB Neck ROM: full    Dental  (+) Dental Advidsory Given, Caps, Missing   Pulmonary neg shortness of breath, asthma , neg recent URI, former smoker,    Pulmonary exam normal breath sounds clear to auscultation       Cardiovascular Exercise Tolerance: Good hypertension, (-) angina(-) Past MI and (-) Cardiac Stents Normal cardiovascular exam(-) dysrhythmias (-) Valvular Problems/Murmurs Rhythm:regular Rate:Normal     Neuro/Psych negative neurological ROS  negative psych ROS   GI/Hepatic Neg liver ROS, GERD  ,  Endo/Other  negative endocrine ROS  Renal/GU negative Renal ROS  negative genitourinary   Musculoskeletal   Abdominal   Peds  Hematology negative hematology ROS (+)   Anesthesia Other Findings Past Medical History: No date: Asthma 2012: Colitis due to Clostridium difficile No date: CTS (carpal tunnel syndrome) No date: Diverticulitis No date: GERD (gastroesophageal reflux disease) No date: HOH (hard of hearing) No date: Hyperlipidemia No date: Hypertension No date: Seasonal allergies   Reproductive/Obstetrics negative OB ROS                             Anesthesia Physical Anesthesia Plan  ASA: II  Anesthesia Plan: General   Post-op Pain Management:    Induction: Intravenous  PONV Risk Score and Plan: 2 and Propofol infusion and TIVA  Airway Management Planned: Natural Airway and Nasal Cannula  Additional Equipment:   Intra-op Plan:   Post-operative Plan:   Informed Consent: I have reviewed the  patients History and Physical, chart, labs and discussed the procedure including the risks, benefits and alternatives for the proposed anesthesia with the patient or authorized representative who has indicated his/her understanding and acceptance.     Dental Advisory Given  Plan Discussed with: Anesthesiologist, CRNA and Surgeon  Anesthesia Plan Comments:         Anesthesia Quick Evaluation

## 2020-01-03 NOTE — Interval H&P Note (Signed)
History and Physical Interval Note:  01/03/2020 10:47 AM  Colin Decker  has presented today for surgery, with the diagnosis of DIARRHEA.  The various methods of treatment have been discussed with the patient and family. After consideration of risks, benefits and other options for treatment, the patient has consented to  Procedure(s): COLONOSCOPY WITH PROPOFOL (N/A) as a surgical intervention.  The patient's history has been reviewed, patient examined, no change in status, stable for surgery.  I have reviewed the patient's chart and labs.  Questions were answered to the patient's satisfaction.     Lesly Rubenstein  Ok to proceed with colonoscopy.

## 2020-01-03 NOTE — H&P (Signed)
Outpatient short stay form Pre-procedure 01/03/2020 10:45 AM Colin Miyamoto MD, MPH  Primary Physician: Dr. Ellison Hughs  Reason for visit:  Change in bowel habits  History of present illness:  Patient presents with change in bowel habits. Maybe some more bowel movements than usual. Had normal colonoscopy in 2015. No blood thinners. No family history of GI malignancies.    Current Facility-Administered Medications:  .  0.9 %  sodium chloride infusion, , Intravenous, Continuous, [place] Kiss, Hilton Cork, MD, Last Rate: 20 mL/hr at 01/03/20 1004, 20 mL/hr at 01/03/20 1004  Medications Prior to Admission  Medication Sig Dispense Refill Last Dose  . albuterol (VENTOLIN HFA) 108 (90 Base) MCG/ACT inhaler Inhale 1-2 puffs into the lungs every 4 (four) hours as needed for wheezing or shortness of breath.   Past Week at Unknown time  . allopurinol (ZYLOPRIM) 100 MG tablet Take 200 mg by mouth daily.    Past Week at Unknown time  . amLODipine (NORVASC) 5 MG tablet Take 1 tablet (5 mg total) by mouth daily. 30 tablet 0 Past Week at Unknown time  . aspirin 81 MG tablet Take 81 mg by mouth daily.   Past Week at Unknown time  . famotidine (PEPCID) 20 MG tablet Take 20 mg by mouth daily.    Past Week at Unknown time  . Fluticasone-Salmeterol (ADVAIR) 250-50 MCG/DOSE AEPB Inhale 1 puff into the lungs 2 (two) times daily.    Past Week at Unknown time  . Ipratropium-Albuterol (COMBIVENT RESPIMAT) 20-100 MCG/ACT AERS respimat Inhale 1 puff into the lungs every 6 (six) hours.   Past Week at Unknown time  . Multiple Vitamin (MULTI-VITAMIN) tablet Take 1 tablet by mouth.    Past Week at Unknown time  . Pramoxine-Benzalkonium Cl 1-0.13 % LIQD Apply topically.   Past Week at Unknown time     Allergies  Allergen Reactions  . Ciprofloxacin     Other reaction(s): Unknown Other reaction(s): Unknown   . Clarithromycin Nausea Only    Other reaction(s): Nausea, Unknown  . Codeine Nausea Only  . Codeine Sulfate  Nausea Only  . Naprosyn [Naproxen] Nausea Only  . Amoxicillin-Pot Clavulanate Rash    Other reaction(s): Other (see comments), Unknown  . Erythromycin Nausea Only and Rash     Past Medical History:  Diagnosis Date  . Asthma   . Colitis due to Clostridium difficile 2012  . CTS (carpal tunnel syndrome)   . Diverticulitis   . GERD (gastroesophageal reflux disease)   . HOH (hard of hearing)   . Hyperlipidemia   . Hypertension   . Seasonal allergies     Review of systems:  Otherwise negative.    Physical Exam  Gen: Alert, oriented. Appears stated age.  HEENT: PERRLA. Lungs: no respiratory distress CV: RR Abd: soft, benign, no masses Ext: No edema    Planned procedures: Proceed with colonoscopy. The patient understands the nature of the planned procedure, indications, risks, alternatives and potential complications including but not limited to bleeding, infection, perforation, damage to internal organs and possible oversedation/side effects from anesthesia. The patient agrees and gives consent to proceed.  Please refer to procedure notes for findings, recommendations and patient disposition/instructions.     Colin Miyamoto MD, MPH Gastroenterology 01/03/2020  10:45 AM

## 2020-01-03 NOTE — Transfer of Care (Signed)
Immediate Anesthesia Transfer of Care Note  Patient: Colin Decker  Procedure(s) Performed: COLONOSCOPY WITH PROPOFOL (N/A )  Patient Location: PACU  Anesthesia Type:General  Level of Consciousness: sedated and responds to stimulation  Airway & Oxygen Therapy: Patient Spontanous Breathing and Patient connected to nasal cannula oxygen  Post-op Assessment: Report given to RN and Post -op Vital signs reviewed and stable  Post vital signs: Reviewed and stable  Last Vitals:  Vitals Value Taken Time  BP 95/40 01/03/20 1118  Temp    Pulse 67 01/03/20 1119  Resp 15 01/03/20 1119  SpO2 100 % 01/03/20 1119  Vitals shown include unvalidated device data.  Last Pain:  Vitals:   01/03/20 1118  TempSrc:   PainSc: Asleep         Complications: No complications documented.

## 2020-01-04 ENCOUNTER — Encounter: Payer: Self-pay | Admitting: Gastroenterology

## 2020-01-04 LAB — SURGICAL PATHOLOGY

## 2020-01-04 NOTE — Anesthesia Postprocedure Evaluation (Signed)
Anesthesia Post Note  Patient: Colin Decker  Procedure(s) Performed: COLONOSCOPY WITH PROPOFOL (N/A )  Patient location during evaluation: Endoscopy Anesthesia Type: General Level of consciousness: awake and alert Pain management: pain level controlled Vital Signs Assessment: post-procedure vital signs reviewed and stable Respiratory status: spontaneous breathing, nonlabored ventilation, respiratory function stable and patient connected to nasal cannula oxygen Cardiovascular status: blood pressure returned to baseline and stable Postop Assessment: no apparent nausea or vomiting Anesthetic complications: no   No complications documented.   Last Vitals:  Vitals:   01/03/20 1128 01/03/20 1138  BP: (!) 107/46 (!) 106/42  Pulse: 71   Resp: 19 15  Temp:    SpO2: 100% 100%    Last Pain:  Vitals:   01/04/20 0744  TempSrc:   PainSc: 0-No pain                 Martha Clan

## 2020-04-18 ENCOUNTER — Other Ambulatory Visit: Payer: Self-pay | Admitting: Orthopedic Surgery

## 2020-04-18 ENCOUNTER — Other Ambulatory Visit (HOSPITAL_COMMUNITY): Payer: Self-pay | Admitting: Orthopedic Surgery

## 2020-04-18 DIAGNOSIS — M5416 Radiculopathy, lumbar region: Secondary | ICD-10-CM

## 2020-04-27 ENCOUNTER — Other Ambulatory Visit: Payer: Self-pay

## 2020-04-27 ENCOUNTER — Ambulatory Visit
Admission: RE | Admit: 2020-04-27 | Discharge: 2020-04-27 | Disposition: A | Discharge status: Home / Self Care | Payer: Medicare Other | Source: Ambulatory Visit | Attending: Orthopedic Surgery | Admitting: Orthopedic Surgery

## 2020-04-27 DIAGNOSIS — M5416 Radiculopathy, lumbar region: Secondary | ICD-10-CM | POA: Diagnosis present

## 2021-11-27 ENCOUNTER — Other Ambulatory Visit: Payer: Self-pay | Admitting: Nephrology

## 2021-11-27 DIAGNOSIS — N1832 Chronic kidney disease, stage 3b: Secondary | ICD-10-CM

## 2021-12-05 ENCOUNTER — Ambulatory Visit
Admission: RE | Admit: 2021-12-05 | Discharge: 2021-12-05 | Disposition: A | Discharge status: Home / Self Care | Payer: Medicare Other | Source: Ambulatory Visit | Attending: Nephrology | Admitting: Nephrology

## 2021-12-05 DIAGNOSIS — N1832 Chronic kidney disease, stage 3b: Secondary | ICD-10-CM | POA: Insufficient documentation

## 2022-01-28 ENCOUNTER — Other Ambulatory Visit: Payer: Self-pay | Admitting: Family Medicine

## 2022-01-28 DIAGNOSIS — R1310 Dysphagia, unspecified: Secondary | ICD-10-CM

## 2022-02-04 ENCOUNTER — Ambulatory Visit
Admission: RE | Admit: 2022-02-04 | Discharge: 2022-02-04 | Disposition: A | Discharge status: Home / Self Care | Payer: Medicare Other | Source: Ambulatory Visit | Attending: Family Medicine | Admitting: Family Medicine

## 2022-02-04 DIAGNOSIS — R1310 Dysphagia, unspecified: Secondary | ICD-10-CM | POA: Insufficient documentation

## 2022-02-19 DIAGNOSIS — D539 Nutritional anemia, unspecified: Secondary | ICD-10-CM | POA: Diagnosis present

## 2022-02-19 DIAGNOSIS — D649 Anemia, unspecified: Secondary | ICD-10-CM | POA: Diagnosis present

## 2022-09-17 IMAGING — US US RENAL
1 series · 14 of 25 positions shown · non-contrast
Comparison: CT scan November 30, 2019

CLINICAL DATA: Chronic renal disease

EXAM:
RENAL / URINARY TRACT ULTRASOUND COMPLETE

[Series 1: us renal · 0.25mm/px · 14 of 40 slices shown]
[im 1/40]
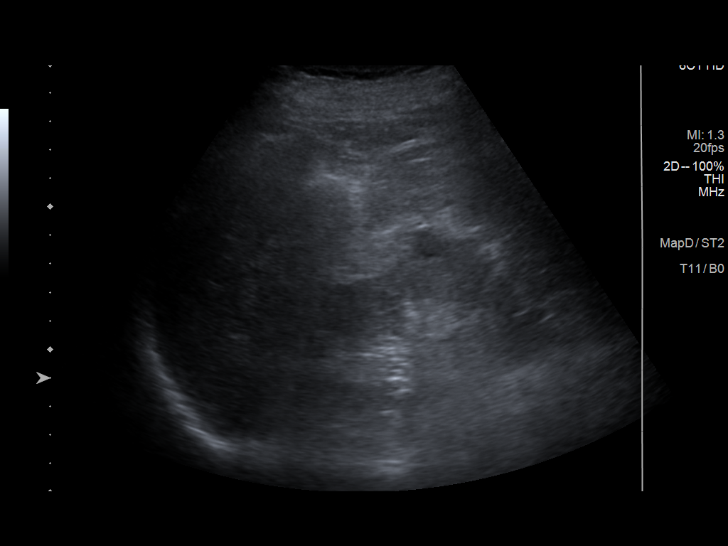
[im 4/40]
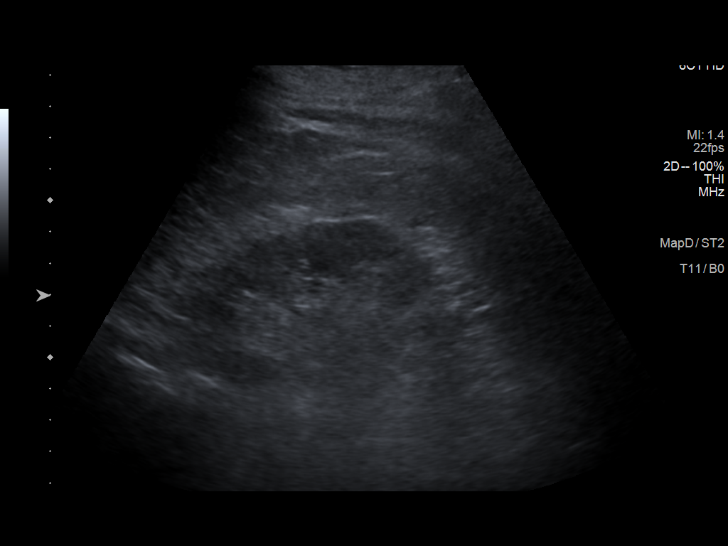
[im 7/40]
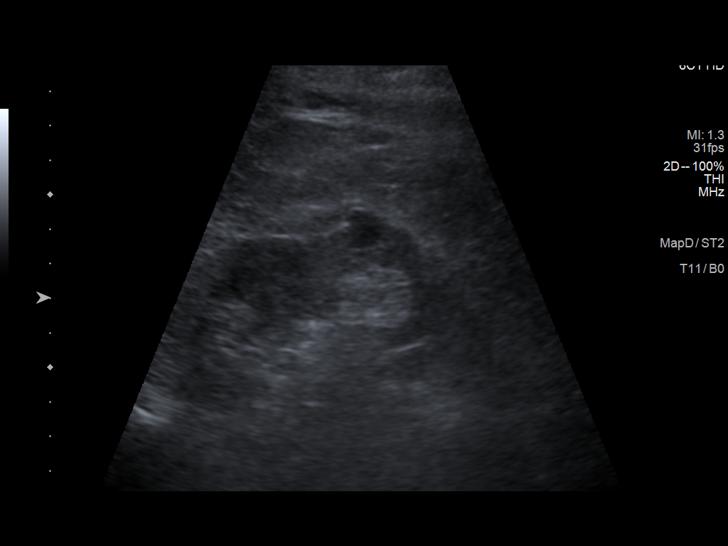
[im 10/40]
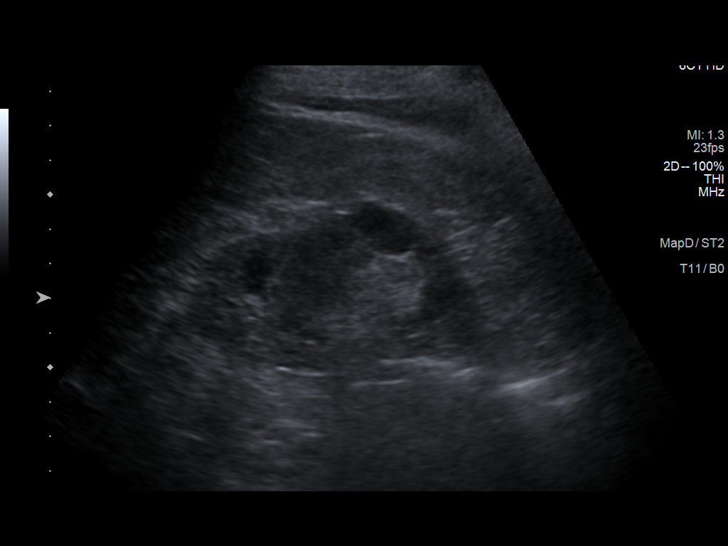
[im 14/40]
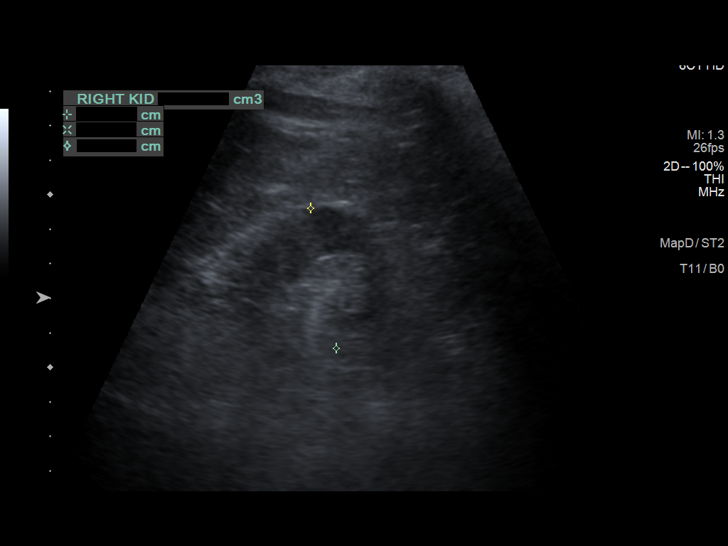
[im 15/40]
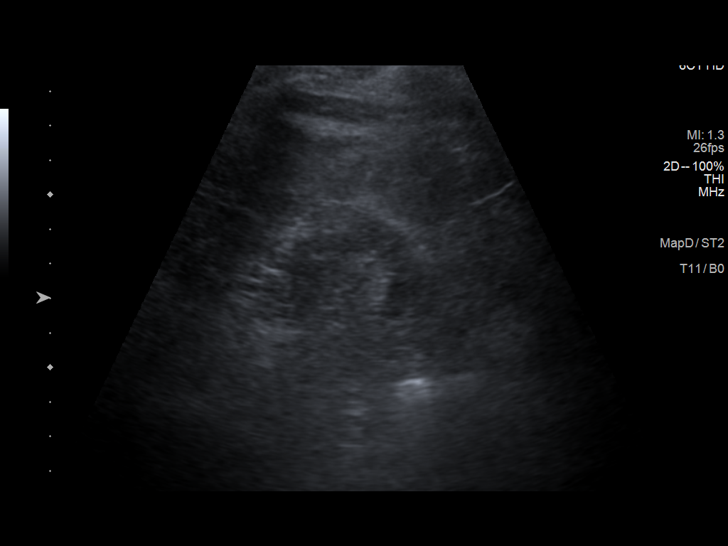
[im 18/40]
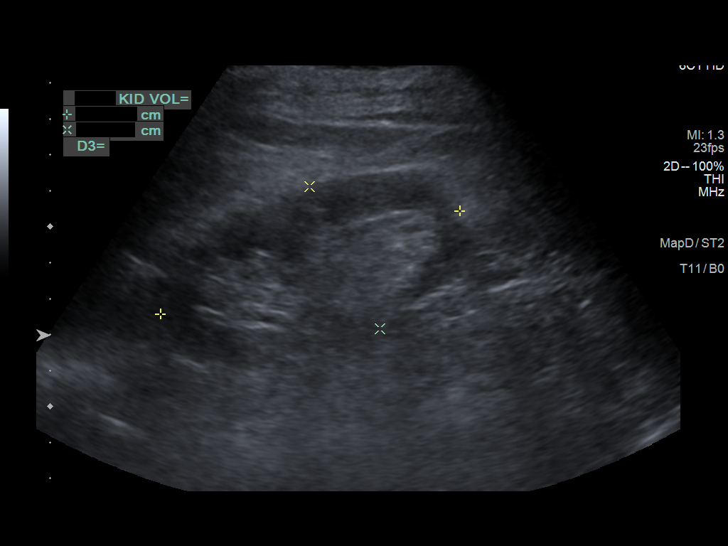
[im 22/40]
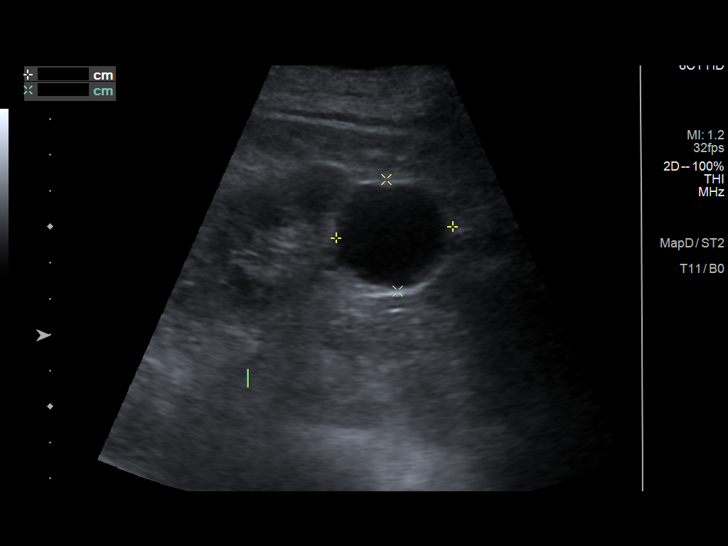
[im 25/40]
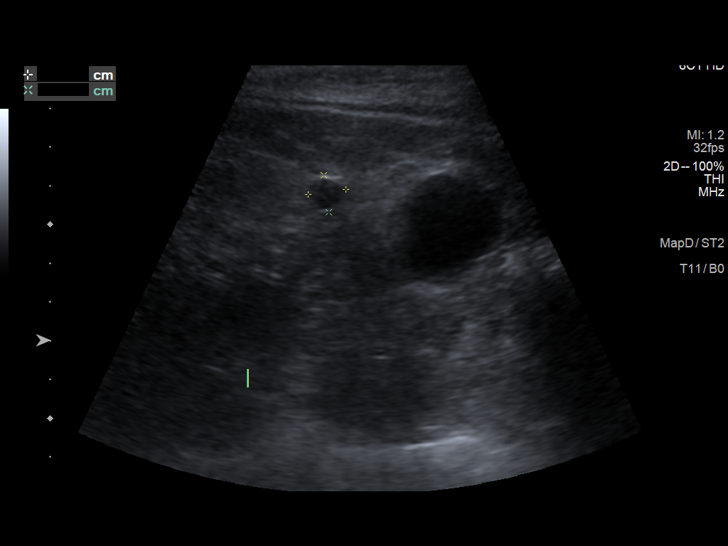
[im 27/40]
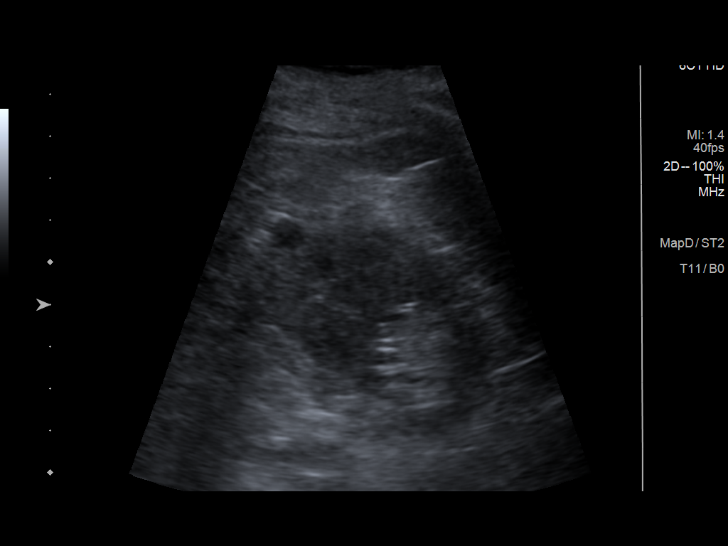
[im 30/40]
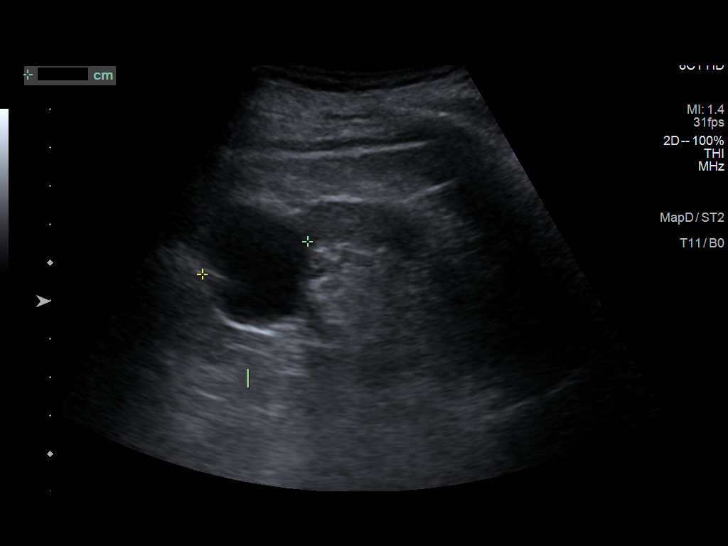
[im 33/40]
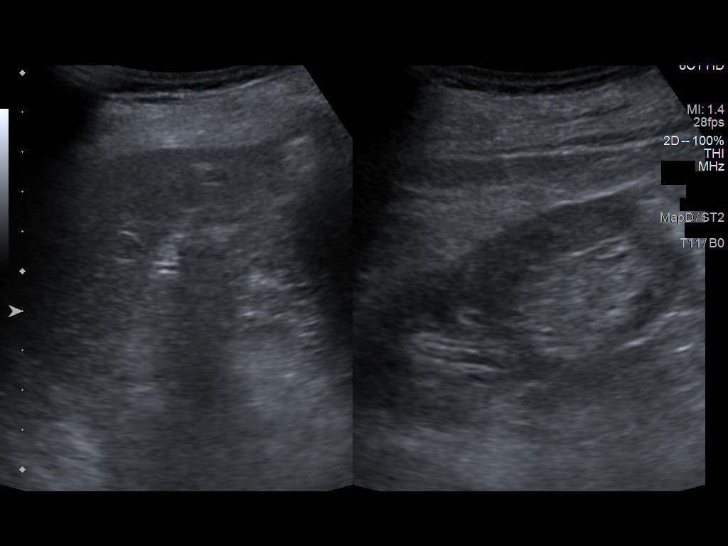
[im 36/40]
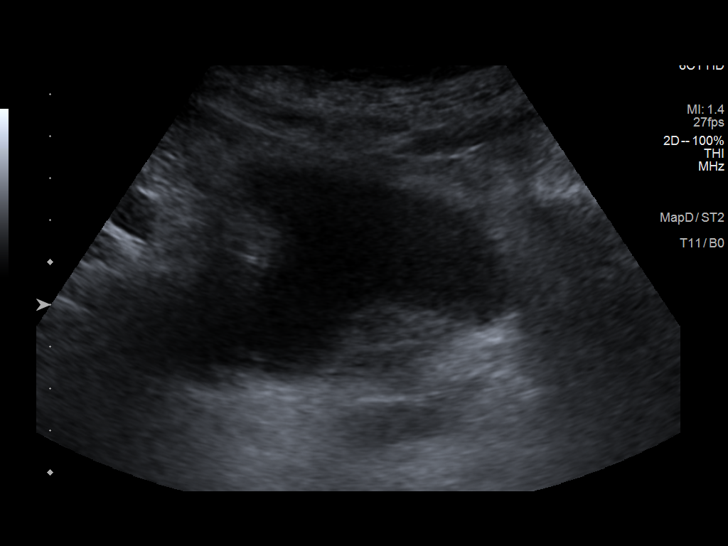
[im 40/40]
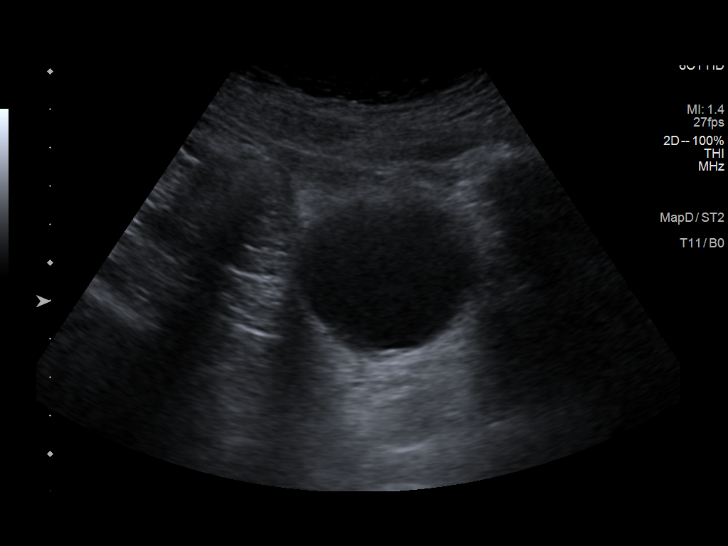

[14 of 25 positions shown; findings below may reference images not displayed]

FINDINGS: Right Kidney:

Renal measurements: 8.5 x 4.4 x 4.1 cm = volume: 80 mL. Contains a
12 mm cyst of no clinical significance. No follow-up imaging
recommended.

Left Kidney:

Renal measurements: 2.8 x 4.4 x 3.7 cm = volume: 76 mL. Contains 2
cysts. The largest is at the lower pole measuring 3.3 cm. The cysts
are of no clinical significance. No follow-up imaging were
recommended.

Bladder:

Appears normal for degree of bladder distention.

Other:

None.
IMPRESSION: 1. No significant abnormalities are associated with either kidney.
The bladder is unremarkable.

## 2022-11-26 ENCOUNTER — Inpatient Hospital Stay: Payer: Medicare Other

## 2022-11-26 ENCOUNTER — Inpatient Hospital Stay: Payer: Medicare Other | Attending: Internal Medicine | Admitting: Internal Medicine

## 2022-11-26 ENCOUNTER — Encounter: Payer: Self-pay | Admitting: Internal Medicine

## 2022-11-26 VITALS — BP 158/67 | HR 63 | Temp 97.6°F | Ht 60.0 in

## 2022-11-26 DIAGNOSIS — D696 Thrombocytopenia, unspecified: Secondary | ICD-10-CM | POA: Diagnosis present

## 2022-11-26 DIAGNOSIS — Z87891 Personal history of nicotine dependence: Secondary | ICD-10-CM | POA: Diagnosis not present

## 2022-11-26 DIAGNOSIS — N183 Chronic kidney disease, stage 3 unspecified: Secondary | ICD-10-CM | POA: Insufficient documentation

## 2022-11-26 LAB — TECHNOLOGIST SMEAR REVIEW
Plt Morphology: NORMAL
RBC MORPHOLOGY: NORMAL
WBC MORPHOLOGY: NORMAL

## 2022-11-26 LAB — CBC WITH DIFFERENTIAL/PLATELET
Abs Immature Granulocytes: 0.05 10*3/uL (ref 0.00–0.07)
Basophils Absolute: 0.1 10*3/uL (ref 0.0–0.1)
Basophils Relative: 1 %
Eosinophils Absolute: 0 10*3/uL (ref 0.0–0.5)
Eosinophils Relative: 0 %
HCT: 29.8 % — ABNORMAL LOW (ref 39.0–52.0)
Hemoglobin: 9.6 g/dL — ABNORMAL LOW (ref 13.0–17.0)
Immature Granulocytes: 1 %
Lymphocytes Relative: 23 %
Lymphs Abs: 1.1 10*3/uL (ref 0.7–4.0)
MCH: 34.5 pg — ABNORMAL HIGH (ref 26.0–34.0)
MCHC: 32.2 g/dL (ref 30.0–36.0)
MCV: 107.2 fL — ABNORMAL HIGH (ref 80.0–100.0)
Monocytes Absolute: 0.4 10*3/uL (ref 0.1–1.0)
Monocytes Relative: 9 %
Neutro Abs: 3.2 10*3/uL (ref 1.7–7.7)
Neutrophils Relative %: 66 %
Platelets: 117 10*3/uL — ABNORMAL LOW (ref 150–400)
RBC: 2.78 MIL/uL — ABNORMAL LOW (ref 4.22–5.81)
RDW: 13.1 % (ref 11.5–15.5)
WBC: 4.8 10*3/uL (ref 4.0–10.5)
nRBC: 0 % (ref 0.0–0.2)

## 2022-11-26 LAB — RETICULOCYTES
Immature Retic Fract: 7 % (ref 2.3–15.9)
RBC.: 2.75 MIL/uL — ABNORMAL LOW (ref 4.22–5.81)
Retic Count, Absolute: 36.6 10*3/uL (ref 19.0–186.0)
Retic Ct Pct: 1.3 % (ref 0.4–3.1)

## 2022-11-26 LAB — LACTATE DEHYDROGENASE: LDH: 164 U/L (ref 98–192)

## 2022-11-26 LAB — PROTIME-INR
INR: 1.1 (ref 0.8–1.2)
Prothrombin Time: 14.7 seconds (ref 11.4–15.2)

## 2022-11-26 LAB — APTT: aPTT: 30 seconds (ref 24–36)

## 2022-11-26 LAB — FOLATE: Folate: >40 ng/mL (ref >5.9)

## 2022-11-26 NOTE — Progress Notes (Signed)
Bruising: yes Bleeding from gums: no

## 2022-11-26 NOTE — Progress Notes (Signed)
[facility] CONSULT NOTE  Patient Care Team: Marisue Ivan, MD as PCP - General (Family Medicine)  CHIEF COMPLAINTS/PURPOSE OF CONSULTATION: Thrombocytopenia  HISTORY OF PRESENTING ILLNESS: Patient ambulating-independently.  Accompanied by wife.   Colin Decker 89 y.o.  male with no significant past medical history except for mild colitis referred to [place] for further evaluation of thrombocytopenia/anemia.  Patient admits to easy bruising although only on mild trauma.  No spontaneous bleeding.   Medications: NO blood thinners.  Alcohol:NO   Hepatitis/HIV: NO   Liver disease/CHF: NO   Review of Systems  Constitutional:  Positive for malaise/fatigue. Negative for chills, diaphoresis, fever and weight loss.  HENT:  Negative for nosebleeds and sore throat.   Eyes:  Negative for double vision.  Respiratory:  Negative for cough, hemoptysis, sputum production, shortness of breath and wheezing.   Cardiovascular:  Negative for chest pain, palpitations, orthopnea and leg swelling.  Gastrointestinal:  Negative for abdominal pain, blood in stool, constipation, diarrhea, heartburn, melena, nausea and vomiting.  Genitourinary:  Negative for dysuria, frequency and urgency.  Musculoskeletal:  Negative for back pain and joint pain.  Skin: Negative.  Negative for itching and rash.  Neurological:  Negative for dizziness, tingling, focal weakness, weakness and headaches.  Endo/Heme/Allergies:  Bruises/bleeds easily.  Psychiatric/Behavioral:  Negative for depression. The patient is not nervous/anxious and does not have insomnia.      MEDICAL HISTORY:  Past Medical History:  Diagnosis Date   Asthma    Colitis due to Clostridium difficile 2012   CTS (carpal tunnel syndrome)    Diverticulitis    GERD (gastroesophageal reflux disease)    HOH (hard of hearing)    Hyperlipidemia    Hypertension    Seasonal allergies     SURGICAL HISTORY: Past Surgical History:   Procedure Laterality Date   CARPAL TUNNEL RELEASE Right    CHOLECYSTECTOMY     COLONOSCOPY WITH PROPOFOL N/A 01/03/2020   Procedure: COLONOSCOPY WITH PROPOFOL;  Surgeon: Regis Bill, MD;  Location: [facility] ENDOSCOPY;  Service: Endoscopy;  Laterality: N/A;   NOSE SURGERY     FOR DEVIATED SEPTUM   SHOULDER SURGERY Left     SOCIAL HISTORY: Social History   Socioeconomic History   Marital status: Married    Spouse name: Not on file   Number of children: Not on file   Years of education: Not on file   Highest education level: Not on file  Occupational History   Not on file  Tobacco Use   Smoking status: Former   Smokeless tobacco: Never  Vaping Use   Vaping Use: Never used  Substance and Sexual Activity   Alcohol use: Yes    Comment: RARE   Drug use: Never   Sexual activity: Not on file  Other Topics Concern   Not on file  Social History Narrative   Not on file   Social Determinants of Health   Financial Resource Strain: Not on file  Food Insecurity: No Food Insecurity (11/26/2022)   Hunger Vital Sign    Worried About Running Out of Food in the Last Year: Never true    Ran Out of Food in the Last Year: Never true  Transportation Needs: No Transportation Needs (11/26/2022)   PRAPARE - Administrator, Civil Service (Medical): No    Lack of Transportation (Non-Medical): No  Physical Activity: Not on file  Stress: Not on file  Social Connections: Not on file  Intimate Partner Violence: Not At Risk (  11/26/2022)   Humiliation, Afraid, Rape, and Kick questionnaire    Fear of Current or Ex-Partner: No    Emotionally Abused: No    Physically Abused: No    Sexually Abused: No    FAMILY HISTORY: History reviewed. No pertinent family history.  ALLERGIES:  is allergic to ciprofloxacin, clarithromycin, codeine, codeine sulfate, naprosyn [naproxen], amoxicillin-pot clavulanate, and erythromycin.  MEDICATIONS:  Current Outpatient Medications  Medication Sig  Dispense Refill   allopurinol (ZYLOPRIM) 100 MG tablet Take 200 mg by mouth daily.      losartan (COZAAR) 50 MG tablet Take 50 mg by mouth daily.     mesalamine (APRISO) 0.375 g 24 hr capsule Take 375 mg by mouth.     Multiple Vitamin (MULTI-VITAMIN) tablet Take 1 tablet by mouth.      albuterol (VENTOLIN HFA) 108 (90 Base) MCG/ACT inhaler Inhale 1-2 puffs into the lungs every 4 (four) hours as needed for wheezing or shortness of breath. (Patient not taking: Reported on 11/26/2022)     amLODipine (NORVASC) 5 MG tablet Take 1 tablet (5 mg total) by mouth daily. (Patient not taking: Reported on 11/26/2022) 30 tablet 0   aspirin 81 MG tablet Take 81 mg by mouth daily. (Patient not taking: Reported on 11/26/2022)     famotidine (PEPCID) 20 MG tablet Take 20 mg by mouth daily.  (Patient not taking: Reported on 11/26/2022)     Fluticasone-Salmeterol (ADVAIR) 250-50 MCG/DOSE AEPB Inhale 1 puff into the lungs 2 (two) times daily.  (Patient not taking: Reported on 11/26/2022)     Ipratropium-Albuterol (COMBIVENT RESPIMAT) 20-100 MCG/ACT AERS respimat Inhale 1 puff into the lungs every 6 (six) hours. (Patient not taking: Reported on 11/26/2022)     Pramoxine-Benzalkonium Cl 1-0.13 % LIQD Apply topically. (Patient not taking: Reported on 11/26/2022)     No current facility-administered medications for this visit.    PHYSICAL EXAMINATION:  Vitals:   11/26/22 1340  BP: (!) 158/67  Pulse: 63  Temp: 97.6 F (36.4 C)  SpO2: 100%   There were no vitals filed for this visit.  Physical Exam Vitals and nursing note reviewed.  HENT:     Head: Normocephalic and atraumatic.     Mouth/Throat:     Pharynx: Oropharynx is clear.  Eyes:     Extraocular Movements: Extraocular movements intact.     Pupils: Pupils are equal, round, and reactive to light.  Cardiovascular:     Rate and Rhythm: Normal rate and regular rhythm.  Pulmonary:     Comments: Decreased breath sounds bilaterally.  Abdominal:     Palpations:  Abdomen is soft.  Musculoskeletal:        General: Normal range of motion.     Cervical back: Normal range of motion.  Skin:    General: Skin is warm.  Neurological:     General: No focal deficit present.     Mental Status: Colin Decker is alert and oriented to person, place, and time.  Psychiatric:        Behavior: Behavior normal.        Judgment: Judgment normal.      LABORATORY DATA:  I have reviewed the data as listed Lab Results  Component Value Date   WBC 5.1 12/25/2019   HGB 11.9 (L) 12/25/2019   HCT 32.8 (L) 12/25/2019   MCV 91.9 12/25/2019   PLT 203 12/25/2019   No results for input(s): "NA", "K", "CL", "CO2", "GLUCOSE", "BUN", "CREATININE", "CALCIUM", "GFRNONAA", "GFRAA", "PROT", "ALBUMIN", "AST", "ALT", "ALKPHOS", "BILITOT", "BILIDIR", "  IBILI" in the last 8760 hours.  RADIOGRAPHIC STUDIES: I have personally reviewed the radiological images as listed and agreed with the findings in the report. No results found.  Thrombocytopenia (HCC) # Anemia/thrombocytopenia [at least since 2021]-hemoglobin 9-10 MCV 107; platelets 120-slowly trending down since 2021.   #  The etiology is unclear.  I do long discussion with patient regarding multiple etiologies including but not limited to liver disease/medications/autoimmune disease.  Discussed that low-grade malignancies like MDS is on the differential.  However I will rule out other obvious causes before recommending a bone marrow biopsy.  Discussed the role of a bone marrow biopsy for further evaluation of hematologic disorder like MDS.  However hold off pending above work-up.   Recommend further work-up including-CBC CMP LDH; review of peripheral smear;  MM panel; K/L light chains.   Ultrasound abdomen-to rule out cirrhosis/splenomegaly.  # hx of colitis [Dr.Locklear]- on mesalamine. Stable.   # Stage III CKD- stable.   Thank you Dr.Locklear for allowing me to participate in the care of your pleasant patient. Please do not hesitate  to contact me with questions or concerns in the interim.   # DISPOSITION: # labs today- CBC CMP LDH; review of peripheral smear;PT/PTT; MM panel; K/l light chain; ;AFP- US abdomen # follow up in 2-3 weeks- MD; no labs- Dr.B  All questions were answered. The patient knows to call the clinic with any problems, questions or concerns.    Earna Coder, MD 11/26/2022 3:04 PM

## 2022-11-26 NOTE — Assessment & Plan Note (Addendum)
#   Anemia/thrombocytopenia [at least since 2021]-hemoglobin 9-10 MCV 107; platelets 120-slowly trending down since 2021.   #  The etiology is unclear.  I do long discussion with patient regarding multiple etiologies including but not limited to liver disease/medications/autoimmune disease.  Discussed that low-grade malignancies like MDS is on the differential.  However I will rule out other obvious causes before recommending a bone marrow biopsy.  Discussed the role of a bone marrow biopsy for further evaluation of hematologic disorder like MDS.  However hold off pending above work-up.   Recommend further work-up including-CBC CMP LDH; review of peripheral smear;  MM panel; K/L light chains.   Ultrasound abdomen-to rule out cirrhosis/splenomegaly.  # hx of colitis [Dr.Locklear]- on mesalamine. Stable.   # Stage III CKD- stable.   Thank you Dr.Locklear for allowing me to participate in the care of your pleasant patient. Please do not hesitate to contact me with questions or concerns in the interim.   # DISPOSITION: # labs today- CBC CMP LDH; review of peripheral smear;PT/PTT; MM panel; K/l light chain; ;AFP- US abdomen # follow up in 2-3 weeks- MD; no labs- Dr.B

## 2022-11-27 LAB — VITAMIN B12: Vitamin B-12: 481 pg/mL (ref 180–914)

## 2022-11-27 LAB — KAPPA/LAMBDA LIGHT CHAINS
Kappa free light chain: 44.9 mg/L — ABNORMAL HIGH (ref 3.3–19.4)
Kappa, lambda light chain ratio: 1.58 (ref 0.26–1.65)
Lambda free light chains: 28.5 mg/L — ABNORMAL HIGH (ref 5.7–26.3)

## 2022-11-27 LAB — HEPATITIS C ANTIBODY: HCV Ab: NONREACTIVE

## 2022-12-02 LAB — MULTIPLE MYELOMA PANEL, SERUM
Albumin SerPl Elph-Mcnc: 3.9 g/dL (ref 2.9–4.4)
Albumin/Glob SerPl: 1.9 — ABNORMAL HIGH (ref 0.7–1.7)
Alpha 1: 0.2 g/dL (ref 0.0–0.4)
Alpha2 Glob SerPl Elph-Mcnc: 0.5 g/dL (ref 0.4–1.0)
B-Globulin SerPl Elph-Mcnc: 0.7 g/dL (ref 0.7–1.3)
Gamma Glob SerPl Elph-Mcnc: 0.7 g/dL (ref 0.4–1.8)
Globulin, Total: 2.1 g/dL — ABNORMAL LOW (ref 2.2–3.9)
IgA: 174 mg/dL (ref 61–437)
IgG (Immunoglobin G), Serum: 709 mg/dL (ref 603–1613)
IgM (Immunoglobulin M), Srm: 79 mg/dL (ref 15–143)
Total Protein ELP: 6 g/dL (ref 6.0–8.5)

## 2022-12-16 ENCOUNTER — Inpatient Hospital Stay (HOSPITAL_BASED_OUTPATIENT_CLINIC_OR_DEPARTMENT_OTHER): Payer: Medicare Other | Admitting: Internal Medicine

## 2022-12-16 ENCOUNTER — Encounter: Payer: Self-pay | Admitting: Internal Medicine

## 2022-12-16 DIAGNOSIS — D696 Thrombocytopenia, unspecified: Secondary | ICD-10-CM

## 2022-12-16 NOTE — Progress Notes (Signed)
Bruising: yes,  has a bruise on the whole left forearm. Bleeding from gums: no

## 2022-12-16 NOTE — Assessment & Plan Note (Addendum)
#   Anemia/thrombocytopenia [at least since 2021]-hemoglobin 9-10 MCV 107; platelets 120-slowly trending down since 2021.  June 2024 ultrasound pending.  Clinically suspicious for low-grade MDS.  MM panel; K/L light chains- WNL; B12- levels; No Alcohol;   # Discussed the role of a bone marrow biopsy for further evaluation of hematologic disorder like MDS.  However I think reasonable to hold off bone marrow biopsy until further evaluation/repeat lab work in 3 months.  Discussed the option of shots/ retacrit-pills based on results of the bone marrow biopsy. Again discussed the role of "shots"-without the bone marrow biopsy however not preferable.  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   # Stage III CKD- stable.   # DISPOSITION:  # print  copy of blood work # US abdomen- schedule as ordered # follow up in 3 months- MD; labs today- CBC CMP LDH; iron studies; ferritin-  Dr.B

## 2022-12-16 NOTE — Progress Notes (Signed)
[facility] CONSULT NOTE  Patient Care Team: Marisue Ivan, MD as PCP - General (Family Medicine)  CHIEF COMPLAINTS/PURPOSE OF CONSULTATION: Thrombocytopenia/anemia  HISTORY OF PRESENTING ILLNESS: Patient ambulating-independently.  Accompanied by wife; daughter.   Colin Decker 89 y.o.  male with no significant past medical history except for mild colitis is here to review results of the blood work ordered for thrombocytopenia/anemia...  Patient admits to easy bruising although only on mild trauma.  No spontaneous bleeding.   Review of Systems  Constitutional:  Positive for malaise/fatigue. Negative for chills, diaphoresis, fever and weight loss.  HENT:  Negative for nosebleeds and sore throat.   Eyes:  Negative for double vision.  Respiratory:  Negative for cough, hemoptysis, sputum production, shortness of breath and wheezing.   Cardiovascular:  Negative for chest pain, palpitations, orthopnea and leg swelling.  Gastrointestinal:  Negative for abdominal pain, blood in stool, constipation, diarrhea, heartburn, melena, nausea and vomiting.  Genitourinary:  Negative for dysuria, frequency and urgency.  Musculoskeletal:  Negative for back pain and joint pain.  Skin: Negative.  Negative for itching and rash.  Neurological:  Negative for dizziness, tingling, focal weakness, weakness and headaches.  Endo/Heme/Allergies:  Bruises/bleeds easily.  Psychiatric/Behavioral:  Negative for depression. The patient is not nervous/anxious and does not have insomnia.      MEDICAL HISTORY:  Past Medical History:  Diagnosis Date   Asthma    Colitis due to Clostridium difficile 2012   CTS (carpal tunnel syndrome)    Diverticulitis    GERD (gastroesophageal reflux disease)    HOH (hard of hearing)    Hyperlipidemia    Hypertension    Seasonal allergies     SURGICAL HISTORY: Past Surgical History:  Procedure Laterality Date   CARPAL TUNNEL RELEASE Right     CHOLECYSTECTOMY     COLONOSCOPY WITH PROPOFOL N/A 01/03/2020   Procedure: COLONOSCOPY WITH PROPOFOL;  Surgeon: Regis Bill, MD;  Location: [facility] ENDOSCOPY;  Service: Endoscopy;  Laterality: N/A;   NOSE SURGERY     FOR DEVIATED SEPTUM   SHOULDER SURGERY Left     SOCIAL HISTORY: Social History   Socioeconomic History   Marital status: Married    Spouse name: Not on file   Number of children: Not on file   Years of education: Not on file   Highest education level: Not on file  Occupational History   Not on file  Tobacco Use   Smoking status: Former   Smokeless tobacco: Never  Vaping Use   Vaping Use: Never used  Substance and Sexual Activity   Alcohol use: Yes    Comment: RARE   Drug use: Never   Sexual activity: Not on file  Other Topics Concern   Not on file  Social History Narrative   Not on file   Social Determinants of Health   Financial Resource Strain: Not on file  Food Insecurity: No Food Insecurity (11/26/2022)   Hunger Vital Sign    Worried About Running Out of Food in the Last Year: Never true    Ran Out of Food in the Last Year: Never true  Transportation Needs: No Transportation Needs (11/26/2022)   PRAPARE - Administrator, Civil Service (Medical): No    Lack of Transportation (Non-Medical): No  Physical Activity: Not on file  Stress: Not on file  Social Connections: Not on file  Intimate Partner Violence: Not At Risk (11/26/2022)   Humiliation, Afraid, Rape, and Kick questionnaire  Fear of Current or Ex-Partner: No    Emotionally Abused: No    Physically Abused: No    Sexually Abused: No    FAMILY HISTORY: History reviewed. No pertinent family history.  ALLERGIES:  is allergic to ciprofloxacin, clarithromycin, codeine, codeine sulfate, naprosyn [naproxen], amoxicillin-pot clavulanate, and erythromycin.  MEDICATIONS:  Current Outpatient Medications  Medication Sig Dispense Refill   albuterol (VENTOLIN HFA) 108 (90 Base) MCG/ACT  inhaler Inhale 1-2 puffs into the lungs every 4 (four) hours as needed for wheezing or shortness of breath. (Patient not taking: Reported on 11/26/2022)     allopurinol (ZYLOPRIM) 100 MG tablet Take 200 mg by mouth daily.      amLODipine (NORVASC) 5 MG tablet Take 1 tablet (5 mg total) by mouth daily. (Patient not taking: Reported on 11/26/2022) 30 tablet 0   aspirin 81 MG tablet Take 81 mg by mouth daily. (Patient not taking: Reported on 11/26/2022)     famotidine (PEPCID) 20 MG tablet Take 20 mg by mouth daily.  (Patient not taking: Reported on 11/26/2022)     Fluticasone-Salmeterol (ADVAIR) 250-50 MCG/DOSE AEPB Inhale 1 puff into the lungs 2 (two) times daily.  (Patient not taking: Reported on 11/26/2022)     Ipratropium-Albuterol (COMBIVENT RESPIMAT) 20-100 MCG/ACT AERS respimat Inhale 1 puff into the lungs every 6 (six) hours. (Patient not taking: Reported on 11/26/2022)     losartan (COZAAR) 50 MG tablet Take 50 mg by mouth daily.     mesalamine (APRISO) 0.375 g 24 hr capsule Take 375 mg by mouth.     Multiple Vitamin (MULTI-VITAMIN) tablet Take 1 tablet by mouth.      Pramoxine-Benzalkonium Cl 1-0.13 % LIQD Apply topically. (Patient not taking: Reported on 11/26/2022)     No current facility-administered medications for this visit.    PHYSICAL EXAMINATION:  Vitals:   12/16/22 1011  BP: (!) 173/56  Pulse: 67  Temp: 98.5 F (36.9 C)  SpO2: 100%    Filed Weights   12/16/22 1011  Weight: 126 lb 12.8 oz (57.5 kg)    Physical Exam Vitals and nursing note reviewed.  HENT:     Head: Normocephalic and atraumatic.     Mouth/Throat:     Pharynx: Oropharynx is clear.  Eyes:     Extraocular Movements: Extraocular movements intact.     Pupils: Pupils are equal, round, and reactive to light.  Cardiovascular:     Rate and Rhythm: Normal rate and regular rhythm.  Pulmonary:     Comments: Decreased breath sounds bilaterally.  Abdominal:     Palpations: Abdomen is soft.  Musculoskeletal:         General: Normal range of motion.     Cervical back: Normal range of motion.  Skin:    General: Skin is warm.  Neurological:     General: No focal deficit present.     Mental Status: He is alert and oriented to person, place, and time.  Psychiatric:        Behavior: Behavior normal.        Judgment: Judgment normal.      LABORATORY DATA:  I have reviewed the data as listed Lab Results  Component Value Date   WBC 4.8 11/26/2022   HGB 9.6 (L) 11/26/2022   HCT 29.8 (L) 11/26/2022   MCV 107.2 (H) 11/26/2022   PLT 117 (L) 11/26/2022   No results for input(s): "NA", "K", "CL", "CO2", "GLUCOSE", "BUN", "CREATININE", "CALCIUM", "GFRNONAA", "GFRAA", "PROT", "ALBUMIN", "AST", "ALT", "ALKPHOS", "BILITOT", "BILIDIR", "IBILI"  in the last 8760 hours.  RADIOGRAPHIC STUDIES: I have personally reviewed the radiological images as listed and agreed with the findings in the report. No results found.  Thrombocytopenia (HCC) # Anemia/thrombocytopenia [at least since 2021]-hemoglobin 9-10 MCV 107; platelets 120-slowly trending down since 2021.  June 2024 ultrasound pending.  Clinically suspicious for low-grade MDS.  MM panel; K/L light chains- WNL; B12- levels;   # Discussed the role of a bone marrow biopsy for further evaluation of hematologic disorder like MDS.  However I think reasonable to hold off bone marrow biopsy until further evaluation/repeat lab work in 3 months.   # hx of colitis [Dr.Locklear]- on mesalamine. stable.   # Stage III CKD- stable.   # DISPOSITION:  # print  copy of blood work # US abdomen- schedule as ordered # follow up in 3 months- MD; labs today- CBC CMP LDH; iron studies; ferritin-  Dr.B  All questions were answered. The patient knows to call the clinic with any problems, questions or concerns.    Earna Coder, MD 12/16/2022 11:04 AM

## 2022-12-30 ENCOUNTER — Ambulatory Visit
Admission: RE | Admit: 2022-12-30 | Discharge: 2022-12-30 | Disposition: A | Discharge status: Home / Self Care | Payer: Medicare Other | Source: Ambulatory Visit | Attending: Internal Medicine | Admitting: Internal Medicine

## 2022-12-30 DIAGNOSIS — D696 Thrombocytopenia, unspecified: Secondary | ICD-10-CM | POA: Diagnosis present

## 2023-02-25 ENCOUNTER — Other Ambulatory Visit: Payer: Self-pay

## 2023-02-25 ENCOUNTER — Encounter: Payer: Self-pay | Admitting: Emergency Medicine

## 2023-02-25 ENCOUNTER — Observation Stay
Admission: EM | Admit: 2023-02-25 | Discharge: 2023-02-26 | Disposition: A | Discharge status: Home / Self Care | Payer: Medicare Other | Attending: Internal Medicine | Admitting: Internal Medicine

## 2023-02-25 DIAGNOSIS — K219 Gastro-esophageal reflux disease without esophagitis: Secondary | ICD-10-CM | POA: Insufficient documentation

## 2023-02-25 DIAGNOSIS — I129 Hypertensive chronic kidney disease with stage 1 through stage 4 chronic kidney disease, or unspecified chronic kidney disease: Secondary | ICD-10-CM | POA: Insufficient documentation

## 2023-02-25 DIAGNOSIS — Z87891 Personal history of nicotine dependence: Secondary | ICD-10-CM | POA: Diagnosis not present

## 2023-02-25 DIAGNOSIS — R7401 Elevation of levels of liver transaminase levels: Secondary | ICD-10-CM | POA: Diagnosis not present

## 2023-02-25 DIAGNOSIS — J45909 Unspecified asthma, uncomplicated: Secondary | ICD-10-CM | POA: Insufficient documentation

## 2023-02-25 DIAGNOSIS — Z794 Long term (current) use of insulin: Secondary | ICD-10-CM | POA: Insufficient documentation

## 2023-02-25 DIAGNOSIS — E876 Hypokalemia: Secondary | ICD-10-CM | POA: Insufficient documentation

## 2023-02-25 DIAGNOSIS — R7989 Other specified abnormal findings of blood chemistry: Secondary | ICD-10-CM | POA: Diagnosis present

## 2023-02-25 DIAGNOSIS — N1831 Chronic kidney disease, stage 3a: Secondary | ICD-10-CM | POA: Diagnosis not present

## 2023-02-25 DIAGNOSIS — D696 Thrombocytopenia, unspecified: Secondary | ICD-10-CM | POA: Insufficient documentation

## 2023-02-25 DIAGNOSIS — M79605 Pain in left leg: Secondary | ICD-10-CM | POA: Insufficient documentation

## 2023-02-25 DIAGNOSIS — M79604 Pain in right leg: Secondary | ICD-10-CM | POA: Diagnosis not present

## 2023-02-25 DIAGNOSIS — E871 Hypo-osmolality and hyponatremia: Secondary | ICD-10-CM | POA: Diagnosis not present

## 2023-02-25 DIAGNOSIS — E875 Hyperkalemia: Secondary | ICD-10-CM | POA: Diagnosis not present

## 2023-02-25 DIAGNOSIS — D539 Nutritional anemia, unspecified: Secondary | ICD-10-CM | POA: Insufficient documentation

## 2023-02-25 DIAGNOSIS — Z7982 Long term (current) use of aspirin: Secondary | ICD-10-CM | POA: Diagnosis not present

## 2023-02-25 DIAGNOSIS — I10 Essential (primary) hypertension: Secondary | ICD-10-CM | POA: Diagnosis present

## 2023-02-25 DIAGNOSIS — R790 Abnormal level of blood mineral: Secondary | ICD-10-CM | POA: Diagnosis present

## 2023-02-25 DIAGNOSIS — D649 Anemia, unspecified: Secondary | ICD-10-CM | POA: Diagnosis present

## 2023-02-25 LAB — CBC WITH DIFFERENTIAL/PLATELET
Abs Immature Granulocytes: 0.02 10*3/uL (ref 0.00–0.07)
Basophils Absolute: 0.1 10*3/uL (ref 0.0–0.1)
Basophils Relative: 1 %
Eosinophils Absolute: 0 10*3/uL (ref 0.0–0.5)
Eosinophils Relative: 0 %
HCT: 26.7 % — ABNORMAL LOW (ref 39.0–52.0)
Hemoglobin: 8.4 g/dL — ABNORMAL LOW (ref 13.0–17.0)
Immature Granulocytes: 0 %
Lymphocytes Relative: 21 %
Lymphs Abs: 1 10*3/uL (ref 0.7–4.0)
MCH: 35.1 pg — ABNORMAL HIGH (ref 26.0–34.0)
MCHC: 31.5 g/dL (ref 30.0–36.0)
MCV: 111.7 fL — ABNORMAL HIGH (ref 80.0–100.0)
Monocytes Absolute: 0.5 10*3/uL (ref 0.1–1.0)
Monocytes Relative: 11 %
Neutro Abs: 3.1 10*3/uL (ref 1.7–7.7)
Neutrophils Relative %: 67 %
Platelets: 113 10*3/uL — ABNORMAL LOW (ref 150–400)
RBC: 2.39 MIL/uL — ABNORMAL LOW (ref 4.22–5.81)
RDW: 13.7 % (ref 11.5–15.5)
WBC: 4.6 10*3/uL (ref 4.0–10.5)
nRBC: 0 % (ref 0.0–0.2)

## 2023-02-25 LAB — BASIC METABOLIC PANEL
Anion gap: 6 (ref 5–15)
Anion gap: 6 (ref 5–15)
BUN: 48 mg/dL — ABNORMAL HIGH (ref 8–23)
BUN: 43 mg/dL — ABNORMAL HIGH (ref 8–23)
CO2: 18 mmol/L — ABNORMAL LOW (ref 22–32)
CO2: 18 mmol/L — ABNORMAL LOW (ref 22–32)
Calcium: 9 mg/dL (ref 8.9–10.3)
Calcium: 8.3 mg/dL — ABNORMAL LOW (ref 8.9–10.3)
Chloride: 112 mmol/L — ABNORMAL HIGH (ref 98–111)
Chloride: 110 mmol/L (ref 98–111)
Creatinine, Ser: 1.65 mg/dL — ABNORMAL HIGH (ref 0.61–1.24)
Creatinine, Ser: 1.52 mg/dL — ABNORMAL HIGH (ref 0.61–1.24)
GFR, Estimated: 39 mL/min — ABNORMAL LOW (ref >60)
GFR, Estimated: 43 mL/min — ABNORMAL LOW (ref >60)
Glucose, Bld: 107 mg/dL — ABNORMAL HIGH (ref 70–99)
Glucose, Bld: 225 mg/dL — ABNORMAL HIGH (ref 70–99)
Potassium: 6 mmol/L — ABNORMAL HIGH (ref 3.5–5.1)
Potassium: 6 mmol/L — ABNORMAL HIGH (ref 3.5–5.1)
Sodium: 136 mmol/L (ref 135–145)
Sodium: 134 mmol/L — ABNORMAL LOW (ref 135–145)

## 2023-02-25 LAB — URINALYSIS, W/ REFLEX TO CULTURE (INFECTION SUSPECTED)
Bacteria, UA: NONE SEEN
Bilirubin Urine: NEGATIVE
Glucose, UA: >=500 mg/dL — AB
Hgb urine dipstick: NEGATIVE
Ketones, ur: NEGATIVE mg/dL
Leukocytes,Ua: NEGATIVE
Nitrite: NEGATIVE
Protein, ur: NEGATIVE mg/dL
Specific Gravity, Urine: 1.005 (ref 1.005–1.030)
Squamous Epithelial / HPF: NONE SEEN /HPF (ref 0–5)
pH: 5 (ref 5.0–8.0)

## 2023-02-25 LAB — CBG MONITORING, ED
Glucose-Capillary: 79 mg/dL (ref 70–99)
Glucose-Capillary: 149 mg/dL — ABNORMAL HIGH (ref 70–99)

## 2023-02-25 MED ORDER — HYDRALAZINE HCL 20 MG/ML IJ SOLN: 5.0000 mg | Freq: Four times a day (QID) | INTRAMUSCULAR | Status: DC | PRN

## 2023-02-25 MED ORDER — DEXTROSE 50 % IV SOLN
1.0000 | Freq: Once | INTRAVENOUS | Status: AC
  Administered 2023-02-25: 50 mL via INTRAVENOUS
  Filled 2023-02-25: qty 50

## 2023-02-25 MED ORDER — ACETAMINOPHEN 650 MG RE SUPP: 650.0000 mg | Freq: Four times a day (QID) | RECTAL | Status: DC | PRN

## 2023-02-25 MED ORDER — ACETAMINOPHEN 325 MG PO TABS: 650.0000 mg | ORAL_TABLET | Freq: Four times a day (QID) | ORAL | Status: DC | PRN

## 2023-02-25 MED ORDER — SODIUM CHLORIDE 0.9% FLUSH
3.0000 mL | Freq: Two times a day (BID) | INTRAVENOUS | Status: DC
  Administered 2023-02-25 – 2023-02-26 (×2): 3 mL via INTRAVENOUS

## 2023-02-25 MED ORDER — DEXTROSE-SODIUM CHLORIDE 5-0.45 % IV SOLN: Freq: Once | INTRAVENOUS | Status: AC

## 2023-02-25 MED ORDER — SODIUM BICARBONATE 8.4 % IV SOLN
50.0000 meq | Freq: Once | INTRAVENOUS | Status: AC
  Administered 2023-02-25: 50 meq via INTRAVENOUS
  Filled 2023-02-25: qty 50

## 2023-02-25 MED ORDER — CALCIUM GLUCONATE 10 % IV SOLN
1.0000 g | Freq: Once | INTRAVENOUS | Status: AC
  Administered 2023-02-25: 1 g via INTRAVENOUS
  Filled 2023-02-25: qty 10

## 2023-02-25 MED ORDER — SODIUM ZIRCONIUM CYCLOSILICATE 10 G PO PACK
10.0000 g | PACK | ORAL | Status: AC
  Administered 2023-02-25: 10 g via ORAL
  Filled 2023-02-25: qty 1

## 2023-02-25 MED ORDER — INSULIN ASPART 100 UNIT/ML IJ SOLN
10.0000 [IU] | Freq: Once | INTRAMUSCULAR | Status: AC
  Administered 2023-02-25: 10 [IU] via INTRAVENOUS
  Filled 2023-02-25: qty 1

## 2023-02-25 MED ORDER — ALLOPURINOL 100 MG PO TABS
100.0000 mg | ORAL_TABLET | Freq: Every day | ORAL | Status: DC
  Administered 2023-02-26: 100 mg via ORAL
  Filled 2023-02-25: qty 1

## 2023-02-25 MED ORDER — LACTATED RINGERS IV SOLN: INTRAVENOUS | Status: DC

## 2023-02-25 MED ORDER — MORPHINE SULFATE (PF) 2 MG/ML IV SOLN: 2.0000 mg | Freq: Once | INTRAVENOUS | Status: DC

## 2023-02-25 MED ORDER — HEPARIN SODIUM (PORCINE) 5000 UNIT/ML IJ SOLN
5000.0000 [IU] | Freq: Two times a day (BID) | INTRAMUSCULAR | Status: DC
  Administered 2023-02-26: 5000 [IU] via SUBCUTANEOUS
  Filled 2023-02-25: qty 1

## 2023-02-25 NOTE — ED Provider Notes (Signed)
[facility] Provider Note    Event Date/Time   First MD Initiated Contact with Patient 02/25/23 1717     (approximate)   History   Chief Complaint: Abnormal outpatient lab  HPI  Colin Decker is a 89 y.o. male with a history of hypertension, GERD who comes to the ED due to an outpatient lab draw today resulting a potassium of 6.2.  Patient denies any symptoms, no recent illness.  Eating and drinking fluids at baseline.  No vomiting or diarrhea, no pain.     Physical Exam   Triage Vital Signs: ED Triage Vitals [02/25/23 1701]  Encounter Vitals Group     BP (!) 180/64     Systolic BP Percentile      Diastolic BP Percentile      Pulse Rate 60     Resp 18     Temp (!) 97.5 F (36.4 C)     Temp Source Oral     SpO2 100 %     Weight 118 lb (53.5 kg)     Height 5' (1.524 m)     Head Circumference      Peak Flow      Pain Score 0     Pain Loc      Pain Education      Exclude from Growth Chart     Most recent vital signs: Vitals:   02/25/23 2042 02/25/23 2100  BP: (!) 186/108 (!) 171/63  Pulse: 68 (!) 57  Resp: 16   Temp:    SpO2: 100% 100%    General: Awake, no distress.  CV:  Good peripheral perfusion.  Regular rate and rhythm Resp:  Normal effort.  Clear to auscultation bilaterally Abd:  No distention.  Soft nontender Other:  Moist oral mucosa.   ED Results / Procedures / Treatments   Labs (all labs ordered are listed, but only abnormal results are displayed) Labs Reviewed  BASIC METABOLIC PANEL - Abnormal; Notable for the following components:      Result Value   Potassium 6.0 (*)    Chloride 112 (*)    CO2 18 (*)    Glucose, Bld 107 (*)    BUN 48 (*)    Creatinine, Ser 1.65 (*)    GFR, Estimated 39 (*)    All other components within normal limits  CBC WITH DIFFERENTIAL/PLATELET - Abnormal; Notable for the following components:   RBC 2.39 (*)    Hemoglobin 8.4 (*)    HCT 26.7 (*)    MCV 111.7 (*)    MCH 35.1 (*)     Platelets 113 (*)    All other components within normal limits  BASIC METABOLIC PANEL - Abnormal; Notable for the following components:   Sodium 134 (*)    Potassium 6.0 (*)    CO2 18 (*)    Glucose, Bld 225 (*)    BUN 43 (*)    Creatinine, Ser 1.52 (*)    Calcium 8.3 (*)    GFR, Estimated 43 (*)    All other components within normal limits  URINALYSIS, W/ REFLEX TO CULTURE (INFECTION SUSPECTED) - Abnormal; Notable for the following components:   Color, Urine STRAW (*)    APPearance CLEAR (*)    Glucose, UA >=500 (*)    All other components within normal limits     EKG Interpreted by me Sinus rhythm, rate of 60.  Normal axis, normal intervals.  Normal QRS ST segments and T waves  RADIOLOGY    PROCEDURES:  Procedures   MEDICATIONS ORDERED IN ED: Medications  sodium zirconium cyclosilicate (LOKELMA) packet 10 g (has no administration in time range)  calcium gluconate inj 10% (1 g) URGENT USE ONLY! (has no administration in time range)  insulin aspart (novoLOG) injection 10 Units (has no administration in time range)  dextrose 50 % solution 50 mL (has no administration in time range)  dextrose 5 % and 0.45 % NaCl infusion (0 mLs Intravenous Stopped 02/25/23 1926)  sodium bicarbonate injection 50 mEq (50 mEq Intravenous Given 02/25/23 1838)     IMPRESSION / MDM / ASSESSMENT AND PLAN / ED COURSE  I reviewed the triage vital signs and the nursing notes.  DDx: AKI, dehydration, erroneous result, hemolysis  Patient's presentation is most consistent with acute presentation with potential threat to life or bodily function.  Patient presents for evaluation of elevated potassium from routine outpatient lab draw.  He has no acute symptoms.  Exam is reassuring.  Suspect this is factitious from sample hemolysis.  Will check BMP and CBC.   Clinical Course as of 02/25/23 2113  Tue Feb 25, 2023  1858 BMP confirms high K, Cl, elevated BUN. Will give 1/2 NS + bicarb to hydrate,  alkalinize, and recheck BMP. [PS]  2053 Repeat BMP not improved. Will bladder scan, check UA, give lokelma + insulin + calcium [PS]    Clinical Course User Index [PS] Sharman Cheek, MD    ----------------------------------------- 9:13 PM on 02/25/2023 ----------------------------------------- Case discussed with hospitalist for further management   FINAL CLINICAL IMPRESSION(S) / ED DIAGNOSES   Final diagnoses:  Hyperkalemia     Rx / DC Orders   ED Discharge Orders     None        Note:  This document was prepared using Dragon voice recognition software and may include unintentional dictation errors.   Sharman Cheek, MD 02/25/23 2114

## 2023-02-25 NOTE — Hospital Course (Signed)
EKG shows sinus tach at 103, prominent T waves, frequent PVCs

## 2023-02-25 NOTE — H&P (Signed)
History and Physical    Patient: Colin Decker:[id number] DOB: [birth year] DOA: 02/25/2023 DOS: the patient was seen and examined on 02/26/2023 PCP: Marisue Ivan, MD  Patient coming from: Home   Chief Complaint: Hyperkalemia.    HPI: Colin Decker is a 89 y.o. male with medical history significant for hypertension, diverticulitis, GERD, asthma, history of C. difficile, history of hypokalemia, history of CKD stage IIIa, presenting with hyperkalemia on labs done at [facility]. Pt is alert, awake oriented, son at bedside and on my exam pt all of sudden started to complaints of bl leg pain.   Review of Systems: Review of Systems  Unable to perform ROS: Age    Past Medical History:  Diagnosis Date   Asthma    Colitis due to Clostridium difficile 2012   CTS (carpal tunnel syndrome)    Diverticulitis    GERD (gastroesophageal reflux disease)    HOH (hard of hearing)    Hyperlipidemia    Hypertension    Seasonal allergies    Past Surgical History:  Procedure Laterality Date   CARPAL TUNNEL RELEASE Right    CHOLECYSTECTOMY     COLONOSCOPY WITH PROPOFOL N/A 01/03/2020   Procedure: COLONOSCOPY WITH PROPOFOL;  Surgeon: Regis Bill, MD;  Location: [facility] ENDOSCOPY;  Service: Endoscopy;  Laterality: N/A;   NOSE SURGERY     FOR DEVIATED SEPTUM   SHOULDER SURGERY Left    Social History:   reports that he has quit smoking. He has never used smokeless tobacco. He reports current alcohol use. He reports that he does not use drugs.  Allergies  Allergen Reactions   Ciprofloxacin     Other reaction(s): Unknown Other reaction(s): Unknown    Clarithromycin Nausea Only    Other reaction(s): Nausea, Unknown   Codeine Nausea Only   Codeine Sulfate Nausea Only   Naprosyn [Naproxen] Nausea Only   Amoxicillin-Pot Clavulanate Rash    Other reaction(s): Other (see comments), Unknown   Erythromycin Nausea Only and Rash    History reviewed. No pertinent family  history.  Prior to Admission medications   Medication Sig Start Date End Date Taking? Authorizing Provider  albuterol (VENTOLIN HFA) 108 (90 Base) MCG/ACT inhaler Inhale 1-2 puffs into the lungs every 4 (four) hours as needed for wheezing or shortness of breath. Patient not taking: Reported on 11/26/2022    [provider]  allopurinol (ZYLOPRIM) 100 MG tablet Take 200 mg by mouth daily.  12/09/18   [provider]  amLODipine (NORVASC) 5 MG tablet Take 1 tablet (5 mg total) by mouth daily. Patient not taking: Reported on 11/26/2022 12/05/19   Lurene Shadow, MD  aspirin 81 MG tablet Take 81 mg by mouth daily. Patient not taking: Reported on 11/26/2022    [provider]  famotidine (PEPCID) 20 MG tablet Take 20 mg by mouth daily.  Patient not taking: Reported on 11/26/2022    [provider]  Fluticasone-Salmeterol (ADVAIR) 250-50 MCG/DOSE AEPB Inhale 1 puff into the lungs 2 (two) times daily.  Patient not taking: Reported on 11/26/2022 01/26/18   [provider]  Ipratropium-Albuterol (COMBIVENT RESPIMAT) 20-100 MCG/ACT AERS respimat Inhale 1 puff into the lungs every 6 (six) hours. Patient not taking: Reported on 11/26/2022    [provider]  losartan (COZAAR) 50 MG tablet Take 50 mg by mouth daily. 05/27/22   [provider]  mesalamine (APRISO) 0.375 g 24 hr capsule Take 375 mg by mouth. 10/26/21   [provider]  Multiple Vitamin (  MULTI-VITAMIN) tablet Take 1 tablet by mouth.     [provider]  Pramoxine-Benzalkonium Cl 1-0.13 % LIQD Apply topically. Patient not taking: Reported on 11/26/2022    [provider]  calcium carbonate (OS-CAL - DOSED IN MG OF ELEMENTAL CALCIUM) 1250 (500 Ca) MG tablet Take 0.5 tablets by mouth.  12/21/18  [provider]  chlorthalidone (HYGROTON) 25 MG tablet Take 25 mg by mouth daily.  12/21/18  [provider]  febuxostat (ULORIC) 40 MG tablet Take 40 mg by mouth  daily.  12/21/18  [provider]  lisinopril (PRINIVIL,ZESTRIL) 20 MG tablet Take 20 mg by mouth daily.  12/21/18  [provider]  RABEprazole (ACIPHEX) 20 MG tablet Take 20 mg by mouth daily.  12/21/18  [provider]     Vitals:   02/26/23 0030 02/26/23 0042 02/26/23 0100 02/26/23 0130  BP: (!) 136/44  (!) 125/47 (!) 145/47  Pulse: 77  82 (!) 55  Resp: 18  18 15   Temp:  97.9 F (36.6 C)    TempSrc:  Oral    SpO2: 96%  99% 99%  Weight:      Height:       Physical Exam Vitals and nursing note reviewed.  Constitutional:      General: He is not in acute distress. HENT:     Head: Normocephalic and atraumatic.     Right Ear: Hearing normal.     Left Ear: Hearing normal.     Nose: Nose normal. No nasal deformity.     Mouth/Throat:     Lips: Pink.     Tongue: No lesions.     Pharynx: Oropharynx is clear.  Eyes:     General: Lids are normal.     Extraocular Movements: Extraocular movements intact.  Cardiovascular:     Rate and Rhythm: Normal rate and regular rhythm.     Pulses:          Dorsalis pedis pulses are 2+ on the right side and 2+ on the left side.       Posterior tibial pulses are 2+ on the right side and 2+ on the left side.     Heart sounds: Normal heart sounds.  Pulmonary:     Effort: Pulmonary effort is normal.     Breath sounds: Normal breath sounds.  Abdominal:     General: Bowel sounds are normal. There is no distension.     Palpations: Abdomen is soft. There is no mass.     Tenderness: There is no abdominal tenderness.  Musculoskeletal:        General: Tenderness present.     Right lower leg: Tenderness present. No edema.     Left lower leg: Tenderness present. No edema.       Legs:  Skin:    General: Skin is warm.  Neurological:     General: No focal deficit present.     Mental Status: He is alert and oriented to person, place, and time.     Cranial Nerves: Cranial nerves 2-12 are intact.  Psychiatric:        Attention  and Perception: Attention normal.        Mood and Affect: Mood normal.        Speech: Speech normal.        Behavior: Behavior normal. Behavior is cooperative.      Labs on Admission: I have personally reviewed following labs and imaging studies  CBC: Recent Labs  Lab 02/25/23 1727  WBC 4.6  NEUTROABS 3.1  HGB 8.4*  HCT 26.7*  MCV 111.7*  PLT 113*   Basic Metabolic Panel: Recent Labs  Lab 02/25/23 1727 02/25/23 1952 02/25/23 2308  NA 136 134* 136  K 6.0* 6.0* 4.8  CL 112* 110 111  CO2 18* 18* 18*  GLUCOSE 107* 225* 86  BUN 48* 43* 39*  CREATININE 1.65* 1.52* 1.40*  CALCIUM 9.0 8.3* 8.8*  MG  --   --  2.3   GFR: Estimated Creatinine Clearance: 23.8 mL/min (A) (by C-G formula based on SCr of 1.4 mg/dL (H)). Liver Function Tests: Recent Labs  Lab 02/25/23 1952  AST 79*  ALT 44  ALKPHOS 19*  BILITOT 0.5  PROT 5.8*  ALBUMIN 3.6   No results for input(s): "LIPASE", "AMYLASE" in the last 168 hours. No results for input(s): "AMMONIA" in the last 168 hours. Coagulation Profile: No results for input(s): "INR", "PROTIME" in the last 168 hours. Cardiac Enzymes: Recent Labs  Lab 02/25/23 2308  CKTOTAL 86   BNP (last 3 results) No results for input(s): "PROBNP" in the last 8760 hours. HbA1C: No results for input(s): "HGBA1C" in the last 72 hours. CBG: Recent Labs  Lab 02/25/23 2205 02/25/23 2254  GLUCAP 79 149*   Lipid Profile: No results for input(s): "CHOL", "HDL", "LDLCALC", "TRIG", "CHOLHDL", "LDLDIRECT" in the last 72 hours. Thyroid Function Tests: Recent Labs    02/25/23 1952  TSH 1.891  FREET4 0.73   Anemia Panel: Recent Labs    02/25/23 1952  FOLATE >40.0   Urinalysis    Component Value Date/Time   COLORURINE STRAW (A) 02/25/2023 2050   APPEARANCEUR CLEAR (A) 02/25/2023 2050   LABSPEC 1.005 02/25/2023 2050   PHURINE 5.0 02/25/2023 2050   GLUCOSEU >=500 (A) 02/25/2023 2050   HGBUR NEGATIVE 02/25/2023 2050   BILIRUBINUR NEGATIVE  02/25/2023 2050   KETONESUR NEGATIVE 02/25/2023 2050   PROTEINUR NEGATIVE 02/25/2023 2050   NITRITE NEGATIVE 02/25/2023 2050   LEUKOCYTESUR NEGATIVE 02/25/2023 2050   Unresulted Labs (From admission, onward)     Start     Ordered   02/26/23 0500  Comprehensive metabolic panel  Tomorrow morning,   R        02/25/23 2236   02/26/23 0500  CBC  Tomorrow morning,   R        02/25/23 2236   02/25/23 2121  Vitamin B12  Add-on,   AD        02/25/23 2120            Medications  heparin injection 5,000 Units (has no administration in time range)  sodium chloride flush (NS) 0.9 % injection 3 mL (3 mLs Intravenous Given 02/25/23 2306)  acetaminophen (TYLENOL) tablet 650 mg (has no administration in time range)    Or  acetaminophen (TYLENOL) suppository 650 mg (has no administration in time range)  hydrALAZINE (APRESOLINE) injection 5 mg (has no administration in time range)  lactated ringers infusion (has no administration in time range)  allopurinol (ZYLOPRIM) tablet 100 mg (has no administration in time range)  morphine (PF) 2 MG/ML injection 2 mg (0 mg Intravenous Hold 02/25/23 2323)  hydrochlorothiazide (HYDRODIURIL) tablet 6.25 mg (has no administration in time range)  dextrose 5 % and 0.45 % NaCl infusion (0 mLs Intravenous Stopped 02/25/23 1926)  sodium bicarbonate injection 50 mEq (50 mEq Intravenous Given 02/25/23 1838)  sodium zirconium cyclosilicate (LOKELMA) packet 10 g (10 g Oral Given 02/25/23 2201)  calcium gluconate inj 10% (1 g) URGENT  USE ONLY! (1 g Intravenous Given 02/25/23 2210)  insulin aspart (novoLOG) injection 10 Units (10 Units Intravenous Given 02/25/23 2207)  dextrose 50 % solution 50 mL (50 mLs Intravenous Given 02/25/23 2206)  dextrose 50 % solution 50 mL (50 mLs Intravenous Given 02/25/23 2207)   Data Reviewed: Relevant notes from primary care and specialist visits, past discharge summaries as available in EHR, including Care Everywhere. Prior diagnostic testing as  pertinent to current admission diagnoses Updated medications and problem lists for reconciliation ED course, including vitals, labs, imaging, treatment and response to treatment Triage notes, nursing and pharmacy notes and ED provider's notes Notable results as noted in HPI  Assessment and Plan: * Hyperkalemia 2/2 to  BP meds and metabolic acidosis from CKD.  We will HOLD ACEI. Pt was treated in ED with dextrose / sodium bicarbonate / Lokelma / Insulin . Repeat potassium is resolved.     Latest Ref Rng & Units 02/25/2023   11:08 PM 02/25/2023    7:52 PM 02/25/2023    5:27 PM  CMP  Glucose 70 - 99 mg/dL 86  161  096   BUN 8 - 23 mg/dL 39  43  48   Creatinine 0.61 - 1.24 mg/dL 0.45  4.09  8.11   Sodium 135 - 145 mmol/L 136  134  136   Potassium 3.5 - 5.1 mmol/L 4.8  6.0  6.0   Chloride 98 - 111 mmol/L 111  110  112   CO2 22 - 32 mmol/L 18  18  18    Calcium 8.9 - 10.3 mg/dL 8.8  8.3  9.0   Total Protein 6.5 - 8.1 g/dL  5.8    Total Bilirubin 0.3 - 1.2 mg/dL  0.5    Alkaline Phos 38 - 126 U/L  19    AST 15 - 41 U/L  79    ALT 0 - 44 U/L  44       LFT elevation Mild ast elevation we will follow and avoid hepatotoxic meds.  ? If related to infection/ meds/ volume. We will hydrate pt overnight correct electrolytes and follow.   Leg pain, bilateral Acute onset BL leg pain when I started to examine him HR on tele is regular vitals are wnl and pulses are intact in both feet. We will get stat dopplers.   Macrocytic anemia Will get B12/folate.  FT4/ TSH.   Stage 3a chronic kidney disease (HCC) Lab Results  Component Value Date   CREATININE 1.40 (H) 02/25/2023   CREATININE 1.52 (H) 02/25/2023   CREATININE 1.65 (H) 02/25/2023  Renally dose meds.  Wills start low dose sodium bicarbonate.   Gastro-esophageal reflux disease without esophagitis IV PPI.    Thrombocytopenia (HCC) Pt has chronic thrombocytopenia.  12/01/19 03:41 12/02/19 05:14 12/03/19 04:48 12/05/19 06:37 12/25/19  21:27 11/26/22 14:37 02/25/23 17:27  Platelets 107 (L) 93 (L) 108 (L) 125 (L) 203 117 (L) 113 (L)  We will follow.  Heparin q12 for DVT.  Essential hypertension Vitals:   02/25/23 1701 02/25/23 1800 02/25/23 2042 02/25/23 2100  BP: (!) 180/64 (!) 160/64 (!) 186/108 (!) 171/63   02/25/23 2130 02/25/23 2200 02/25/23 2230 02/26/23 0000  BP: (!) 168/62 (!) 169/61 (!) 190/71 (!) 138/46   02/26/23 0030 02/26/23 0100 02/26/23 0130  BP: (!) 136/44 (!) 125/47 (!) 145/47  Hold zestril. Will start low dose hydrochlorothiazide at 6.25 mg bedtime.  Continue amlodipine.  Avoid  BB/CCB as HR is bradycardic intermittently.   Hyponatremia Pt with initial  sodium of 134 . Mild due to meds and or decreased PO intake.    DVT prophylaxis:  Heparin   Consults:  None   Advance Care Planning:    Code Status: Full Code   Family Communication:  None.  Disposition Plan:  Home.   Severity of Illness: The appropriate patient status for this patient is INPATIENT. Inpatient status is judged to be reasonable and necessary in order to provide the required intensity of service to ensure the patient's safety. The patient's presenting symptoms, physical exam findings, and initial radiographic and laboratory data in the context of their chronic comorbidities is felt to place them at high risk for further clinical deterioration. Furthermore, it is not anticipated that the patient will be medically stable for discharge from the hospital within 2 midnights of admission.   * I certify that at the point of admission it is my clinical judgment that the patient will require inpatient hospital care spanning beyond 2 midnights from the point of admission due to high intensity of service, high risk for further deterioration and high frequency of surveillance required.*  Author: Gertha Calkin, MD 02/26/2023 2:05 AM  For on call review [email or web address].

## 2023-02-25 NOTE — ED Notes (Signed)
Pt with urge to have BM. Pt assisted to toilet in rm. Pt able to have medium BM at this time. Pt ambulatory back to bed and placed back on cardiac monitor. Pt son at bedside. Call light within reach.

## 2023-02-25 NOTE — ED Triage Notes (Signed)
Patient to ED via POV for abnormal labs- potassium of 6.2. Seen at PCP this AM for routine visit. Pt denies any complaints at this time.

## 2023-02-26 ENCOUNTER — Observation Stay: Payer: Medicare Other

## 2023-02-26 DIAGNOSIS — M79604 Pain in right leg: Secondary | ICD-10-CM | POA: Diagnosis not present

## 2023-02-26 DIAGNOSIS — R7989 Other specified abnormal findings of blood chemistry: Secondary | ICD-10-CM | POA: Diagnosis present

## 2023-02-26 DIAGNOSIS — E875 Hyperkalemia: Secondary | ICD-10-CM | POA: Diagnosis not present

## 2023-02-26 LAB — FOLATE: Folate: >40 ng/mL (ref >5.9)

## 2023-02-26 LAB — BASIC METABOLIC PANEL
Anion gap: 7 (ref 5–15)
BUN: 39 mg/dL — ABNORMAL HIGH (ref 8–23)
CO2: 18 mmol/L — ABNORMAL LOW (ref 22–32)
Calcium: 8.8 mg/dL — ABNORMAL LOW (ref 8.9–10.3)
Chloride: 111 mmol/L (ref 98–111)
Creatinine, Ser: 1.4 mg/dL — ABNORMAL HIGH (ref 0.61–1.24)
GFR, Estimated: 47 mL/min — ABNORMAL LOW (ref >60)
Glucose, Bld: 86 mg/dL (ref 70–99)
Potassium: 4.8 mmol/L (ref 3.5–5.1)
Sodium: 136 mmol/L (ref 135–145)

## 2023-02-26 LAB — COMPREHENSIVE METABOLIC PANEL
ALT: 52 U/L — ABNORMAL HIGH (ref 0–44)
AST: 63 U/L — ABNORMAL HIGH (ref 15–41)
Albumin: 3.6 g/dL (ref 3.5–5.0)
Alkaline Phosphatase: 62 U/L (ref 38–126)
Anion gap: 5 (ref 5–15)
BUN: 39 mg/dL — ABNORMAL HIGH (ref 8–23)
CO2: 20 mmol/L — ABNORMAL LOW (ref 22–32)
Calcium: 8.7 mg/dL — ABNORMAL LOW (ref 8.9–10.3)
Chloride: 110 mmol/L (ref 98–111)
Creatinine, Ser: 1.35 mg/dL — ABNORMAL HIGH (ref 0.61–1.24)
GFR, Estimated: 49 mL/min — ABNORMAL LOW (ref >60)
Glucose, Bld: 90 mg/dL (ref 70–99)
Potassium: 5.5 mmol/L — ABNORMAL HIGH (ref 3.5–5.1)
Sodium: 135 mmol/L (ref 135–145)
Total Bilirubin: 0.9 mg/dL (ref 0.3–1.2)
Total Protein: 5.5 g/dL — ABNORMAL LOW (ref 6.5–8.1)

## 2023-02-26 LAB — HEPATIC FUNCTION PANEL
ALT: 44 U/L (ref 0–44)
AST: 79 U/L — ABNORMAL HIGH (ref 15–41)
Albumin: 3.6 g/dL (ref 3.5–5.0)
Alkaline Phosphatase: 19 U/L — ABNORMAL LOW (ref 38–126)
Bilirubin, Direct: 0.3 mg/dL — ABNORMAL HIGH (ref 0.0–0.2)
Indirect Bilirubin: 0.2 mg/dL — ABNORMAL LOW (ref 0.3–0.9)
Total Bilirubin: 0.5 mg/dL (ref 0.3–1.2)
Total Protein: 5.8 g/dL — ABNORMAL LOW (ref 6.5–8.1)

## 2023-02-26 LAB — CBC
HCT: 22.6 % — ABNORMAL LOW (ref 39.0–52.0)
Hemoglobin: 7.5 g/dL — ABNORMAL LOW (ref 13.0–17.0)
MCH: 34.9 pg — ABNORMAL HIGH (ref 26.0–34.0)
MCHC: 33.2 g/dL (ref 30.0–36.0)
MCV: 105.1 fL — ABNORMAL HIGH (ref 80.0–100.0)
Platelets: 107 10*3/uL — ABNORMAL LOW (ref 150–400)
RBC: 2.15 MIL/uL — ABNORMAL LOW (ref 4.22–5.81)
RDW: 13.4 % (ref 11.5–15.5)
WBC: 6 10*3/uL (ref 4.0–10.5)
nRBC: 0 % (ref 0.0–0.2)

## 2023-02-26 LAB — MAGNESIUM: Magnesium: 2.3 mg/dL (ref 1.7–2.4)

## 2023-02-26 LAB — CK: Total CK: 86 U/L (ref 49–397)

## 2023-02-26 LAB — T4, FREE: Free T4: 0.73 ng/dL (ref 0.61–1.12)

## 2023-02-26 LAB — VITAMIN B12: Vitamin B-12: 641 pg/mL (ref 180–914)

## 2023-02-26 LAB — POTASSIUM: Potassium: 4.9 mmol/L (ref 3.5–5.1)

## 2023-02-26 LAB — TSH: TSH: 1.891 u[IU]/mL (ref 0.350–4.500)

## 2023-02-26 MED ORDER — MESALAMINE ER 250 MG PO CPCR
1500.0000 mg | ORAL_CAPSULE | Freq: Every day | ORAL | Status: DC
  Filled 2023-02-26: qty 6

## 2023-02-26 MED ORDER — AMLODIPINE BESYLATE 5 MG PO TABS: 5.0000 mg | ORAL_TABLET | Freq: Every day | ORAL | 0 refills | Status: AC

## 2023-02-26 MED ORDER — MESALAMINE 400 MG PO CPDR
1200.0000 mg | DELAYED_RELEASE_CAPSULE | Freq: Every day | ORAL | Status: DC
  Administered 2023-02-26: 1200 mg via ORAL
  Filled 2023-02-26: qty 3

## 2023-02-26 MED ORDER — HYDROCHLOROTHIAZIDE 12.5 MG PO TABS
6.2500 mg | ORAL_TABLET | Freq: Every day | ORAL | Status: DC
  Administered 2023-02-26: 6.25 mg via ORAL
  Filled 2023-02-26: qty 1

## 2023-02-26 NOTE — Assessment & Plan Note (Signed)
Vitals:   02/25/23 1701 02/25/23 1800 02/25/23 2042 02/25/23 2100  BP: (!) 180/64 (!) 160/64 (!) 186/108 (!) 171/63   02/25/23 2130 02/25/23 2200 02/25/23 2230 02/26/23 0000  BP: (!) 168/62 (!) 169/61 (!) 190/71 (!) 138/46   02/26/23 0030 02/26/23 0100 02/26/23 0130  BP: (!) 136/44 (!) 125/47 (!) 145/47  Hold zestril. Will start low dose hydrochlorothiazide at 6.25 mg bedtime.  Continue amlodipine.  Avoid  BB/CCB as HR is bradycardic intermittently.

## 2023-02-26 NOTE — Plan of Care (Signed)

## 2023-02-26 NOTE — Assessment & Plan Note (Signed)
Pt has chronic thrombocytopenia.  12/01/19 03:41 12/02/19 05:14 12/03/19 04:48 12/05/19 06:37 12/25/19 21:27 11/26/22 14:37 02/25/23 17:27  Platelets 107 (L) 93 (L) 108 (L) 125 (L) 203 117 (L) 113 (L)  We will follow.  Heparin q12 for DVT.

## 2023-02-26 NOTE — Discharge Summary (Signed)
Physician Discharge Summary  Colin Decker ZOX:[id number] DOB: [birth year] DOA: 02/25/2023  PCP: Marisue Ivan, MD  Admit date: 02/25/2023 Discharge date: 02/26/2023  Admitted From: Home Disposition:  Home  Recommendations for Outpatient Follow-up:  Follow up with PCP in 1-2 weeks   Home Health:No Equipment/Devices:None   Discharge Condition:Stable  CODE STATUS:FULL  Diet recommendation: Reg  Brief/Interim Summary:  Colin Decker is a 89 y.o. male with medical history significant for hypertension, diverticulitis, GERD, asthma, history of C. difficile, history of hypokalemia, history of CKD stage IIIa, presenting with hyperkalemia on labs done at outside facility.  9/4: Potassium improved to 4.9.  Exact etiology is unclear.  Per family at bedside patient does not drink much water.  He has also been continued on his losartan.  I suspect the etiology is secondary to above.  On discharge recommend discontinuation of losartan.  Start amlodipine 5 mg daily.  Follow-up outpatient PCP in 1 week to discuss further antihypertensive strategy.    Discharge Diagnoses:  Principal Problem:   Hyperkalemia Active Problems:   Hyponatremia   Essential hypertension   Thrombocytopenia (HCC)   Gastro-esophageal reflux disease without esophagitis   Stage 3a chronic kidney disease (HCC)   Macrocytic anemia   Leg pain, bilateral   LFT elevation  Hyperkalemia Potassium has improved.  Etiology likely secondary to continuation of angiotensin receptor blocker and poor hydration.  Recommend adequate hydration on discharge.  Hold losartan.  Start amlodipine 5 mg daily.  Follow-up outpatient PCP in 1 week.  Discharge Instructions  Discharge Instructions     Diet - low sodium heart healthy   Complete by: As directed    Increase activity slowly   Complete by: As directed       Allergies as of 02/26/2023       Reactions   Ciprofloxacin    Other reaction(s): Unknown Other reaction(s):  Unknown   Clarithromycin Nausea Only   Other reaction(s): Nausea, Unknown   Codeine Nausea Only   Codeine Sulfate Nausea Only   Naprosyn [naproxen] Nausea Only   Amoxicillin-pot Clavulanate Rash   Other reaction(s): Other (see comments), Unknown   Erythromycin Nausea Only, Rash        Medication List     STOP taking these medications    losartan 50 MG tablet Commonly known as: COZAAR       TAKE these medications    allopurinol 100 MG tablet Commonly known as: ZYLOPRIM Take 200 mg by mouth daily.   amLODipine 5 MG tablet Commonly known as: NORVASC Take 1 tablet (5 mg total) by mouth daily.   mesalamine 0.375 g 24 hr capsule Commonly known as: APRISO Take 1,500 mg by mouth daily.   Multi-Vitamin tablet Take 1 tablet by mouth.        Follow-up Information     Marisue Ivan, MD. Schedule an appointment as soon as possible for a visit in 1 week(s).   Specialty: Family Medicine Contact information: [address] [facility] [place] [address] [phone]                Allergies  Allergen Reactions   Ciprofloxacin     Other reaction(s): Unknown Other reaction(s): Unknown    Clarithromycin Nausea Only    Other reaction(s): Nausea, Unknown   Codeine Nausea Only   Codeine Sulfate Nausea Only   Naprosyn [Naproxen] Nausea Only   Amoxicillin-Pot Clavulanate Rash    Other reaction(s): Other (see comments), Unknown   Erythromycin Nausea Only and Rash  Consultations: None   Procedures/Studies: US Venous Img Lower Bilateral (DVT)  Result Date: 02/26/2023 CLINICAL DATA:  Bilateral leg pain EXAM: BILATERAL LOWER EXTREMITY VENOUS DOPPLER ULTRASOUND TECHNIQUE: Gray-scale sonography with compression, as well as color and duplex ultrasound, were performed to evaluate the deep venous system(s) from the level of the common femoral vein through the popliteal and proximal calf veins. COMPARISON:  None Available. FINDINGS: On both  sides : Normal compressibility of the common femoral, superficial femoral, and popliteal veins, as well as the visualized calf veins. Visualized portions of profunda femoral vein and great saphenous vein unremarkable. No filling defects to suggest DVT on grayscale or color Doppler imaging. Doppler waveforms show normal direction of venous flow, normal respiratory plasticity and response to augmentation. IMPRESSION: Negative for DVT in the lower extremities. Electronically Signed   By: Tiburcio Pea M.D.   On: 02/26/2023 04:04      Subjective: Seen and examined at the time of discharge.  Family bedside.  Stable no distress.  Back to baseline.  Stable for discharge home.  Discharge Exam: Vitals:   02/26/23 0207 02/26/23 0752  BP: (!) 157/75 (!) 157/51  Pulse: 80 66  Resp: 17 14  Temp: 97.9 F (36.6 C)   SpO2: 99% 98%   Vitals:   02/26/23 0130 02/26/23 0207 02/26/23 0209 02/26/23 0752  BP: (!) 145/47 (!) 157/75  (!) 157/51  Pulse: (!) 55 80  66  Resp: 15 17  14   Temp:  97.9 F (36.6 C)    TempSrc:    Oral  SpO2: 99% 99%  98%  Weight:   55.5 kg   Height:   5' (1.524 m)     General: Pt is alert, awake, not in acute distress Cardiovascular: RRR, S1/S2 +, no rubs, no gallops Respiratory: CTA bilaterally, no wheezing, no rhonchi Abdominal: Soft, NT, ND, bowel sounds + Extremities: no edema, no cyanosis    The results of significant diagnostics from this hospitalization (including imaging, microbiology, ancillary and laboratory) are listed below for reference.     Microbiology: No results found for this or any previous visit (from the past 240 hour(s)).   Labs: BNP (last 3 results) No results for input(s): "BNP" in the last 8760 hours. Basic Metabolic Panel: Recent Labs  Lab 02/25/23 1727 02/25/23 1952 02/25/23 2308 02/26/23 0452 02/26/23 1208  NA 136 134* 136 135  --   K 6.0* 6.0* 4.8 5.5* 4.9  CL 112* 110 111 110  --   CO2 18* 18* 18* 20*  --   GLUCOSE 107* 225*  86 90  --   BUN 48* 43* 39* 39*  --   CREATININE 1.65* 1.52* 1.40* 1.35*  --   CALCIUM 9.0 8.3* 8.8* 8.7*  --   MG  --   --  2.3  --   --    Liver Function Tests: Recent Labs  Lab 02/25/23 1952 02/26/23 0452  AST 79* 63*  ALT 44 52*  ALKPHOS 19* 62  BILITOT 0.5 0.9  PROT 5.8* 5.5*  ALBUMIN 3.6 3.6   No results for input(s): "LIPASE", "AMYLASE" in the last 168 hours. No results for input(s): "AMMONIA" in the last 168 hours. CBC: Recent Labs  Lab 02/25/23 1727 02/26/23 0452  WBC 4.6 6.0  NEUTROABS 3.1  --   HGB 8.4* 7.5*  HCT 26.7* 22.6*  MCV 111.7* 105.1*  PLT 113* 107*   Cardiac Enzymes: Recent Labs  Lab 02/25/23 2308  CKTOTAL 86   BNP: Invalid input(s): "  POCBNP" CBG: Recent Labs  Lab 02/25/23 2205 02/25/23 2254  GLUCAP 79 149*   D-Dimer No results for input(s): "DDIMER" in the last 72 hours. Hgb A1c No results for input(s): "HGBA1C" in the last 72 hours. Lipid Profile No results for input(s): "CHOL", "HDL", "LDLCALC", "TRIG", "CHOLHDL", "LDLDIRECT" in the last 72 hours. Thyroid function studies Recent Labs    02/25/23 1952  TSH 1.891   Anemia work up Recent Labs    02/25/23 1952 02/25/23 2308  VITAMINB12  --  641  FOLATE >40.0  --    Urinalysis    Component Value Date/Time   COLORURINE STRAW (A) 02/25/2023 2050   APPEARANCEUR CLEAR (A) 02/25/2023 2050   LABSPEC 1.005 02/25/2023 2050   PHURINE 5.0 02/25/2023 2050   GLUCOSEU >=500 (A) 02/25/2023 2050   HGBUR NEGATIVE 02/25/2023 2050   BILIRUBINUR NEGATIVE 02/25/2023 2050   KETONESUR NEGATIVE 02/25/2023 2050   PROTEINUR NEGATIVE 02/25/2023 2050   NITRITE NEGATIVE 02/25/2023 2050   LEUKOCYTESUR NEGATIVE 02/25/2023 2050   Sepsis Labs Recent Labs  Lab 02/25/23 1727 02/26/23 0452  WBC 4.6 6.0   Microbiology No results found for this or any previous visit (from the past 240 hour(s)).   Time coordinating discharge: Over 30 minutes  SIGNED:   Tresa Moore, MD  [facility] 02/26/2023, 1:34 PM Pager   If 7PM-7AM, please contact night-coverage

## 2023-02-26 NOTE — Assessment & Plan Note (Signed)
Will get B12/folate.  FT4/ TSH.

## 2023-02-26 NOTE — Assessment & Plan Note (Signed)
IV PPI. 

## 2023-02-26 NOTE — Assessment & Plan Note (Addendum)
2/2 to  BP meds and metabolic acidosis from CKD.  We will HOLD ACEI. Pt was treated in ED with dextrose / sodium bicarbonate / Lokelma / Insulin . Repeat potassium is resolved.     Latest Ref Rng & Units 02/25/2023   11:08 PM 02/25/2023    7:52 PM 02/25/2023    5:27 PM  CMP  Glucose 70 - 99 mg/dL 86  161  096   BUN 8 - 23 mg/dL 39  43  48   Creatinine 0.61 - 1.24 mg/dL 0.45  4.09  8.11   Sodium 135 - 145 mmol/L 136  134  136   Potassium 3.5 - 5.1 mmol/L 4.8  6.0  6.0   Chloride 98 - 111 mmol/L 111  110  112   CO2 22 - 32 mmol/L 18  18  18    Calcium 8.9 - 10.3 mg/dL 8.8  8.3  9.0   Total Protein 6.5 - 8.1 g/dL  5.8    Total Bilirubin 0.3 - 1.2 mg/dL  0.5    Alkaline Phos 38 - 126 U/L  19    AST 15 - 41 U/L  79    ALT 0 - 44 U/L  44

## 2023-02-26 NOTE — Assessment & Plan Note (Signed)
Mild ast elevation we will follow and avoid hepatotoxic meds.  ? If related to infection/ meds/ volume. We will hydrate pt overnight correct electrolytes and follow.

## 2023-02-26 NOTE — Assessment & Plan Note (Signed)
Acute onset BL leg pain when I started to examine him HR on tele is regular vitals are wnl and pulses are intact in both feet. We will get stat dopplers.

## 2023-02-26 NOTE — Assessment & Plan Note (Addendum)
Pt with initial sodium of 134 . Mild due to meds and or decreased PO intake.

## 2023-02-26 NOTE — Assessment & Plan Note (Signed)
Lab Results  Component Value Date   CREATININE 1.40 (H) 02/25/2023   CREATININE 1.52 (H) 02/25/2023   CREATININE 1.65 (H) 02/25/2023  Renally dose meds.  Wills start low dose sodium bicarbonate.

## 2023-03-18 ENCOUNTER — Inpatient Hospital Stay: Payer: Medicare Other | Attending: Internal Medicine

## 2023-03-18 ENCOUNTER — Inpatient Hospital Stay (HOSPITAL_BASED_OUTPATIENT_CLINIC_OR_DEPARTMENT_OTHER): Payer: Medicare Other | Admitting: Internal Medicine

## 2023-03-18 ENCOUNTER — Encounter: Payer: Self-pay | Admitting: Internal Medicine

## 2023-03-18 DIAGNOSIS — D696 Thrombocytopenia, unspecified: Secondary | ICD-10-CM

## 2023-03-18 DIAGNOSIS — N183 Chronic kidney disease, stage 3 unspecified: Secondary | ICD-10-CM | POA: Insufficient documentation

## 2023-03-18 DIAGNOSIS — D649 Anemia, unspecified: Secondary | ICD-10-CM | POA: Insufficient documentation

## 2023-03-18 DIAGNOSIS — Z87891 Personal history of nicotine dependence: Secondary | ICD-10-CM | POA: Insufficient documentation

## 2023-03-18 DIAGNOSIS — K529 Noninfective gastroenteritis and colitis, unspecified: Secondary | ICD-10-CM | POA: Insufficient documentation

## 2023-03-18 LAB — CMP (CANCER CENTER ONLY)
ALT: 22 U/L (ref 0–44)
AST: 33 U/L (ref 15–41)
Albumin: 4 g/dL (ref 3.5–5.0)
Alkaline Phosphatase: 81 U/L (ref 38–126)
Anion gap: 5 (ref 5–15)
BUN: 39 mg/dL — ABNORMAL HIGH (ref 8–23)
CO2: 21 mmol/L — ABNORMAL LOW (ref 22–32)
Calcium: 8.9 mg/dL (ref 8.9–10.3)
Chloride: 108 mmol/L (ref 98–111)
Creatinine: 1.6 mg/dL — ABNORMAL HIGH (ref 0.61–1.24)
GFR, Estimated: 40 mL/min — ABNORMAL LOW (ref >60)
Glucose, Bld: 143 mg/dL — ABNORMAL HIGH (ref 70–99)
Potassium: 4.5 mmol/L (ref 3.5–5.1)
Sodium: 134 mmol/L — ABNORMAL LOW (ref 135–145)
Total Bilirubin: 0.8 mg/dL (ref 0.3–1.2)
Total Protein: 6.4 g/dL — ABNORMAL LOW (ref 6.5–8.1)

## 2023-03-18 LAB — CBC WITH DIFFERENTIAL (CANCER CENTER ONLY)
Abs Immature Granulocytes: 0.02 10*3/uL (ref 0.00–0.07)
Basophils Absolute: 0 10*3/uL (ref 0.0–0.1)
Basophils Relative: 1 %
Eosinophils Absolute: 0 10*3/uL (ref 0.0–0.5)
Eosinophils Relative: 0 %
HCT: 25.8 % — ABNORMAL LOW (ref 39.0–52.0)
Hemoglobin: 8.5 g/dL — ABNORMAL LOW (ref 13.0–17.0)
Immature Granulocytes: 0 %
Lymphocytes Relative: 22 %
Lymphs Abs: 1 10*3/uL (ref 0.7–4.0)
MCH: 35 pg — ABNORMAL HIGH (ref 26.0–34.0)
MCHC: 32.9 g/dL (ref 30.0–36.0)
MCV: 106.2 fL — ABNORMAL HIGH (ref 80.0–100.0)
Monocytes Absolute: 0.4 10*3/uL (ref 0.1–1.0)
Monocytes Relative: 10 %
Neutro Abs: 3.1 10*3/uL (ref 1.7–7.7)
Neutrophils Relative %: 67 %
Platelet Count: 141 10*3/uL — ABNORMAL LOW (ref 150–400)
RBC: 2.43 MIL/uL — ABNORMAL LOW (ref 4.22–5.81)
RDW: 13.2 % (ref 11.5–15.5)
WBC Count: 4.5 10*3/uL (ref 4.0–10.5)
nRBC: 0 % (ref 0.0–0.2)

## 2023-03-18 LAB — IRON AND TIBC
Iron: 79 ug/dL (ref 45–182)
Saturation Ratios: 33 % (ref 17.9–39.5)
TIBC: 239 ug/dL — ABNORMAL LOW (ref 250–450)
UIBC: 160 ug/dL

## 2023-03-18 LAB — FERRITIN: Ferritin: 174 ng/mL (ref 24–336)

## 2023-03-18 LAB — LACTATE DEHYDROGENASE: LDH: 178 U/L (ref 98–192)

## 2023-03-18 NOTE — Progress Notes (Signed)
[facility] CONSULT NOTE  Patient Care Team: Marisue Ivan, MD as PCP - General (Family Medicine) Earna Coder, MD as Consulting Physician (Oncology)  CHIEF COMPLAINTS/PURPOSE OF CONSULTATION: Thrombocytopenia/anemia  HISTORY OF PRESENTING ILLNESS: Patient ambulating-independently.  Accompanied by wife.   Colin Decker 89 y.o.  male with no significant past medical history except for mild colitis; CKD stage III ? MDS with thrombocytopenia/anemia [no bone marrow] is here for a follow up.   Patient was recently admitted to hospital for hyperkalemia on labs done at outside facility- thought secondary to dehydration/ARBC.   Mild to moderate fatigue. No in stool; or black colored stools. Patient admits to easy bruising although only on mild trauma.  No spontaneous bleeding.   Review of Systems  Constitutional:  Positive for malaise/fatigue. Negative for chills, diaphoresis, fever and weight loss.  HENT:  Negative for nosebleeds and sore throat.   Eyes:  Negative for double vision.  Respiratory:  Negative for cough, hemoptysis, sputum production, shortness of breath and wheezing.   Cardiovascular:  Negative for chest pain, palpitations, orthopnea and leg swelling.  Gastrointestinal:  Negative for abdominal pain, blood in stool, constipation, diarrhea, heartburn, melena, nausea and vomiting.  Genitourinary:  Negative for dysuria, frequency and urgency.  Musculoskeletal:  Negative for back pain and joint pain.  Skin: Negative.  Negative for itching and rash.  Neurological:  Negative for dizziness, tingling, focal weakness, weakness and headaches.  Endo/Heme/Allergies:  Bruises/bleeds easily.  Psychiatric/Behavioral:  Negative for depression. The patient is not nervous/anxious and does not have insomnia.      MEDICAL HISTORY:  Past Medical History:  Diagnosis Date   Asthma    Colitis due to Clostridium difficile 2012   CTS (carpal tunnel syndrome)     Diverticulitis    GERD (gastroesophageal reflux disease)    HOH (hard of hearing)    Hyperlipidemia    Hypertension    Seasonal allergies     SURGICAL HISTORY: Past Surgical History:  Procedure Laterality Date   CARPAL TUNNEL RELEASE Right    CHOLECYSTECTOMY     COLONOSCOPY WITH PROPOFOL N/A 01/03/2020   Procedure: COLONOSCOPY WITH PROPOFOL;  Surgeon: Regis Bill, MD;  Location: [facility] ENDOSCOPY;  Service: Endoscopy;  Laterality: N/A;   NOSE SURGERY     FOR DEVIATED SEPTUM   SHOULDER SURGERY Left     SOCIAL HISTORY: Social History   Socioeconomic History   Marital status: Married    Spouse name: Not on file   Number of children: Not on file   Years of education: Not on file   Highest education level: Not on file  Occupational History   Not on file  Tobacco Use   Smoking status: Former   Smokeless tobacco: Never  Vaping Use   Vaping status: Never Used  Substance and Sexual Activity   Alcohol use: Yes    Comment: RARE   Drug use: Never   Sexual activity: Not on file  Other Topics Concern   Not on file  Social History Narrative   Not on file   Social Determinants of Health   Financial Resource Strain: Low Risk  (03/04/2023)   Received from [facility]   Overall Financial Resource Strain (CARDIA)    Difficulty of Paying Living Expenses: Not hard at all  Food Insecurity: No Food Insecurity (03/04/2023)   Received from [facility]   Hunger Vital Sign    Worried About Running Out of Food in the Last Year:  Never true    Ran Out of Food in the Last Year: Never true  Transportation Needs: No Transportation Needs (03/04/2023)   Received from [facility] - Transportation    In the past 12 months, has lack of transportation kept you from medical appointments or from getting medications?: No    Lack of Transportation (Non-Medical): No  Physical Activity: Not on file  Stress: Not on file  Social  Connections: Not on file  Intimate Partner Violence: Not At Risk (02/26/2023)   Humiliation, Afraid, Rape, and Kick questionnaire    Fear of Current or Ex-Partner: No    Emotionally Abused: No    Physically Abused: No    Sexually Abused: No    FAMILY HISTORY: History reviewed. No pertinent family history.  ALLERGIES:  is allergic to ciprofloxacin, clarithromycin, codeine, codeine sulfate, naprosyn [naproxen], amoxicillin-pot clavulanate, and erythromycin.  MEDICATIONS:  Current Outpatient Medications  Medication Sig Dispense Refill   allopurinol (ZYLOPRIM) 100 MG tablet Take 200 mg by mouth daily.      amLODipine (NORVASC) 5 MG tablet Take 1 tablet (5 mg total) by mouth daily. 30 tablet 0   mesalamine (APRISO) 0.375 g 24 hr capsule Take 1,500 mg by mouth daily.     Multiple Vitamin (MULTI-VITAMIN) tablet Take 1 tablet by mouth.      No current facility-administered medications for this visit.    PHYSICAL EXAMINATION:  Vitals:   03/18/23 1015  BP: (!) 150/58  Pulse: 67  Resp: 20  Temp: 98.9 F (37.2 C)  SpO2: 100%    Filed Weights   03/18/23 1015  Weight: 124 lb 3.2 oz (56.3 kg)    Physical Exam Vitals and nursing note reviewed.  HENT:     Head: Normocephalic and atraumatic.     Mouth/Throat:     Pharynx: Oropharynx is clear.  Eyes:     Extraocular Movements: Extraocular movements intact.     Pupils: Pupils are equal, round, and reactive to light.  Cardiovascular:     Rate and Rhythm: Normal rate and regular rhythm.  Pulmonary:     Comments: Decreased breath sounds bilaterally.  Abdominal:     Palpations: Abdomen is soft.  Musculoskeletal:        General: Normal range of motion.     Cervical back: Normal range of motion.  Skin:    General: Skin is warm.  Neurological:     General: No focal deficit present.     Mental Status: He is alert and oriented to person, place, and time.  Psychiatric:        Behavior: Behavior normal.        Judgment: Judgment  normal.      LABORATORY DATA:  I have reviewed the data as listed Lab Results  Component Value Date   WBC 4.5 03/18/2023   HGB 8.5 (L) 03/18/2023   HCT 25.8 (L) 03/18/2023   MCV 106.2 (H) 03/18/2023   PLT 141 (L) 03/18/2023   Recent Labs    02/25/23 1952 02/25/23 2308 02/26/23 0452 02/26/23 1208 03/18/23 0920  NA 134* 136 135  --  134*  K 6.0* 4.8 5.5* 4.9 4.5  CL 110 111 110  --  108  CO2 18* 18* 20*  --  21*  GLUCOSE 225* 86 90  --  143*  BUN 43* 39* 39*  --  39*  CREATININE 1.52* 1.40* 1.35*  --  1.60*  CALCIUM 8.3* 8.8* 8.7*  --  8.9  GFRNONAA 43* 47* 49*  --  40*  PROT 5.8*  --  5.5*  --  6.4*  ALBUMIN 3.6  --  3.6  --  4.0  AST 79*  --  63*  --  33  ALT 44  --  52*  --  22  ALKPHOS 19*  --  62  --  81  BILITOT 0.5  --  0.9  --  0.8  BILIDIR 0.3*  --   --   --   --   IBILI 0.2*  --   --   --   --     RADIOGRAPHIC STUDIES: I have personally reviewed the radiological images as listed and agreed with the findings in the report. US Venous Img Lower Bilateral (DVT)  Result Date: 02/26/2023 CLINICAL DATA:  Bilateral leg pain EXAM: BILATERAL LOWER EXTREMITY VENOUS DOPPLER ULTRASOUND TECHNIQUE: Gray-scale sonography with compression, as well as color and duplex ultrasound, were performed to evaluate the deep venous system(s) from the level of the common femoral vein through the popliteal and proximal calf veins. COMPARISON:  None Available. FINDINGS: On both sides : Normal compressibility of the common femoral, superficial femoral, and popliteal veins, as well as the visualized calf veins. Visualized portions of profunda femoral vein and great saphenous vein unremarkable. No filling defects to suggest DVT on grayscale or color Doppler imaging. Doppler waveforms show normal direction of venous flow, normal respiratory plasticity and response to augmentation. IMPRESSION: Negative for DVT in the lower extremities. Electronically Signed   By: Tiburcio Pea M.D.   On: 02/26/2023  04:04    Thrombocytopenia (HCC) # Anemia/thrombocytopenia [at least since 2021]-hemoglobin 9-10 MCV 107; platelets 120-slowly trending down since 2021.  June 2024-  No acute sonographic etiology for thrombocytosis is identified.  Heterogeneous hepatic echogenicity, nonspecific- still Clinically suspicious for low-grade MDS.  MM panel; K/L light chains- WNL; B12- levels; No Alcohol;   # Discussed the role of a bone marrow biopsy for further evaluation of hematologic disorder like MDS.  However I think reasonable to hold off bone marrow biopsy until further evaluation/repeat lab work in 3 months.  Discussed the option of shots/ retacrit-pills based on results of the bone marrow biopsy. Again discussed the role of "shots"-without the bone marrow biopsy however not preferable.  # Worsening baseline anemia/ CKD: awaiting on Iron studies- #Recommend gentle iron [iron biglycinate; 28 mg ] 1 pill a day.  This pill is unlikely to cause stomach upset or cause constipation.   #SEP 2024-  Recent episode of hyperkalemia- ? Dehydration/ARB - improved K today 4.5.   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   # Stage III CKD- stable.   # DISPOSITION:  # print  copy of blood work # follow up in 4 months- MD; labs today- CBC CMP LDH; iron studies; ferritin-  Dr.B   All questions were answered. The patient knows to call the clinic with any problems, questions or concerns.    Earna Coder, MD 03/18/2023 10:55 AM

## 2023-03-18 NOTE — Progress Notes (Signed)
Patient has no concerns 

## 2023-03-18 NOTE — Patient Instructions (Signed)
#  Recommend gentle iron [iron biglycinate; 28 mg ] 1 pill a day.  This pill is unlikely to cause stomach upset or cause constipation.

## 2023-03-18 NOTE — Assessment & Plan Note (Addendum)
#   Anemia/thrombocytopenia [at least since 2021]-hemoglobin 9-10 MCV 107; platelets 120-slowly trending down since 2021.  June 2024-  No acute sonographic etiology for thrombocytosis is identified.  Heterogeneous hepatic echogenicity, nonspecific- still Clinically suspicious for low-grade MDS.  MM panel; K/L light chains- WNL; B12- levels; No Alcohol;   # Discussed the role of a bone marrow biopsy for further evaluation of hematologic disorder like MDS.  However I think reasonable to hold off bone marrow biopsy until further evaluation/repeat lab work in 3 months.  Discussed the option of shots/ retacrit-pills based on results of the bone marrow biopsy. Again discussed the role of "shots"-without the bone marrow biopsy however not preferable.  # Worsening baseline anemia/ CKD: awaiting on Iron studies- #Recommend gentle iron [iron biglycinate; 28 mg ] 1 pill a day.  This pill is unlikely to cause stomach upset or cause constipation.   #SEP 2024-  Recent episode of hyperkalemia- ? Dehydration/ARB - improved K today 4.5.   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   # Stage III CKD- stable.   # DISPOSITION:  # print  copy of blood work # follow up in 4 months- MD; labs today- CBC CMP LDH; iron studies; ferritin-  Dr.B

## 2023-07-17 ENCOUNTER — Inpatient Hospital Stay: Payer: Medicare Other

## 2023-07-17 ENCOUNTER — Inpatient Hospital Stay: Payer: Medicare Other | Admitting: Internal Medicine

## 2023-07-18 ENCOUNTER — Ambulatory Visit: Payer: Medicare Other | Admitting: Internal Medicine

## 2023-07-18 ENCOUNTER — Other Ambulatory Visit: Payer: Medicare Other

## 2023-07-23 ENCOUNTER — Telehealth: Payer: Self-pay | Admitting: *Deleted

## 2023-07-23 ENCOUNTER — Encounter: Payer: Self-pay | Admitting: Internal Medicine

## 2023-07-23 ENCOUNTER — Inpatient Hospital Stay (HOSPITAL_BASED_OUTPATIENT_CLINIC_OR_DEPARTMENT_OTHER): Payer: Medicare Other | Admitting: Internal Medicine

## 2023-07-23 ENCOUNTER — Inpatient Hospital Stay: Payer: Medicare Other | Attending: Internal Medicine

## 2023-07-23 VITALS — BP 157/56 | HR 65 | Temp 98.5°F | Wt 122.0 lb

## 2023-07-23 DIAGNOSIS — K529 Noninfective gastroenteritis and colitis, unspecified: Secondary | ICD-10-CM | POA: Insufficient documentation

## 2023-07-23 DIAGNOSIS — N492 Inflammatory disorders of scrotum: Secondary | ICD-10-CM | POA: Insufficient documentation

## 2023-07-23 DIAGNOSIS — R5383 Other fatigue: Secondary | ICD-10-CM | POA: Diagnosis not present

## 2023-07-23 DIAGNOSIS — D649 Anemia, unspecified: Secondary | ICD-10-CM | POA: Diagnosis not present

## 2023-07-23 DIAGNOSIS — Z87891 Personal history of nicotine dependence: Secondary | ICD-10-CM | POA: Insufficient documentation

## 2023-07-23 DIAGNOSIS — N183 Chronic kidney disease, stage 3 unspecified: Secondary | ICD-10-CM | POA: Diagnosis not present

## 2023-07-23 DIAGNOSIS — D696 Thrombocytopenia, unspecified: Secondary | ICD-10-CM

## 2023-07-23 LAB — CBC WITH DIFFERENTIAL (CANCER CENTER ONLY)
Abs Immature Granulocytes: 0.03 10*3/uL (ref 0.00–0.07)
Basophils Absolute: 0.1 10*3/uL (ref 0.0–0.1)
Basophils Relative: 1 %
Eosinophils Absolute: 0.1 10*3/uL (ref 0.0–0.5)
Eosinophils Relative: 1 %
HCT: 26.9 % — ABNORMAL LOW (ref 39.0–52.0)
Hemoglobin: 8.6 g/dL — ABNORMAL LOW (ref 13.0–17.0)
Immature Granulocytes: 1 %
Lymphocytes Relative: 25 %
Lymphs Abs: 1.3 10*3/uL (ref 0.7–4.0)
MCH: 34.7 pg — ABNORMAL HIGH (ref 26.0–34.0)
MCHC: 32 g/dL (ref 30.0–36.0)
MCV: 108.5 fL — ABNORMAL HIGH (ref 80.0–100.0)
Monocytes Absolute: 0.4 10*3/uL (ref 0.1–1.0)
Monocytes Relative: 8 %
Neutro Abs: 3.5 10*3/uL (ref 1.7–7.7)
Neutrophils Relative %: 64 %
Platelet Count: 132 10*3/uL — ABNORMAL LOW (ref 150–400)
RBC: 2.48 MIL/uL — ABNORMAL LOW (ref 4.22–5.81)
RDW: 14 % (ref 11.5–15.5)
WBC Count: 5.5 10*3/uL (ref 4.0–10.5)
nRBC: 0 % (ref 0.0–0.2)

## 2023-07-23 LAB — CMP (CANCER CENTER ONLY)
ALT: 14 U/L (ref 0–44)
AST: 26 U/L (ref 15–41)
Albumin: 3.9 g/dL (ref 3.5–5.0)
Alkaline Phosphatase: 75 U/L (ref 38–126)
Anion gap: 6 (ref 5–15)
BUN: 47 mg/dL — ABNORMAL HIGH (ref 8–23)
CO2: 19 mmol/L — ABNORMAL LOW (ref 22–32)
Calcium: 8.7 mg/dL — ABNORMAL LOW (ref 8.9–10.3)
Chloride: 112 mmol/L — ABNORMAL HIGH (ref 98–111)
Creatinine: 1.63 mg/dL — ABNORMAL HIGH (ref 0.61–1.24)
GFR, Estimated: 39 mL/min — ABNORMAL LOW (ref >60)
Glucose, Bld: 175 mg/dL — ABNORMAL HIGH (ref 70–99)
Potassium: 4.8 mmol/L (ref 3.5–5.1)
Sodium: 137 mmol/L (ref 135–145)
Total Bilirubin: 0.6 mg/dL (ref 0.0–1.2)
Total Protein: 6.2 g/dL — ABNORMAL LOW (ref 6.5–8.1)

## 2023-07-23 LAB — FERRITIN: Ferritin: 163 ng/mL (ref 24–336)

## 2023-07-23 LAB — IRON AND TIBC
Iron: 89 ug/dL (ref 45–182)
Saturation Ratios: 38 % (ref 17.9–39.5)
TIBC: 235 ug/dL — ABNORMAL LOW (ref 250–450)
UIBC: 146 ug/dL

## 2023-07-23 LAB — LACTATE DEHYDROGENASE: LDH: 150 U/L (ref 98–192)

## 2023-07-23 MED ORDER — CEPHALEXIN 500 MG PO CAPS: 500.0000 mg | ORAL_CAPSULE | Freq: Three times a day (TID) | ORAL | 0 refills | Status: DC

## 2023-07-23 NOTE — Assessment & Plan Note (Addendum)
#   Anemia/thrombocytopenia [at least since 2021]-hemoglobin 9-10 MCV 107; platelets 120-slowly trending down since 2021.  June 2024-  No acute sonographic etiology for thrombocytosis is identified.  Heterogeneous hepatic echogenicity, nonspecific- still Clinically suspicious for low-grade MDS.  MM panel; K/L light chains- WNL; B12- levels; No Alcohol;   # Discussed the role of a bone marrow biopsy for further evaluation of hematologic disorder like MDS.  Discussed with the patient the bone marrow biopsy and aspiration indication and procedure at length.  Given significant discomfort involved-I would recommend under anesthesia/with radiology in the hospital. I discussed the potential complications include-bleeding/trauma and risk of infection; which are fortunately very rare.  Patient is in agreement. Patient will sign the consent prior to the procedure. Bone marrow biopsy/aspiration is ordered.  I also discussed the option of shots/ retacrit-pills based on results of the bone marrow biopsy.   # Worsening baseline anemia/ GUY:QIHKVQQV gentle iron [iron biglycinate; 28 mg ] 1 pill a day- sep Iron sat-33%.  #SEP 2024-  Recent episode of hyperkalemia- ? Dehydration/ARB - improved K today 4.5.   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   # Stage III CKD- stable.   # scrotal cellulitis- recommend kelfex 500 mg TID x10 days.    # DISPOSITION:  # Bone marrow Biopsy- in 1 week- # follow up in 3 weeks- MD; labs CBC CMP LDH- possible Retacrit or venofer-  Dr.B

## 2023-07-23 NOTE — Progress Notes (Signed)
[facility] CONSULT NOTE  Patient Care Team: Marisue Ivan, MD as PCP - General (Family Medicine) Earna Coder, MD as Consulting Physician (Oncology)  CHIEF COMPLAINTS/PURPOSE OF CONSULTATION: Thrombocytopenia/anemia  HISTORY OF PRESENTING ILLNESS: Patient ambulating-independently.  Accompanied by wife.   Colin Decker 89 y.o.  male with no significant past medical history except for mild colitis; CKD stage III ? MDS with thrombocytopenia/anemia [no bone marrow] is here for a follow up.   Patient has had no recent hospitalizations. Complains of worsening fatigue. No in stool; or black colored stools. Patient admits to easy bruising although only on mild trauma.  No spontaneous bleeding.   Notes to have redness of his scrotum; no pain; no ulceration.   Review of Systems  Constitutional:  Positive for malaise/fatigue. Negative for chills, diaphoresis, fever and weight loss.  HENT:  Negative for nosebleeds and sore throat.   Eyes:  Negative for double vision.  Respiratory:  Negative for cough, hemoptysis, sputum production, shortness of breath and wheezing.   Cardiovascular:  Negative for chest pain, palpitations, orthopnea and leg swelling.  Gastrointestinal:  Negative for abdominal pain, blood in stool, constipation, diarrhea, heartburn, melena, nausea and vomiting.  Genitourinary:  Negative for dysuria, frequency and urgency.  Musculoskeletal:  Negative for back pain and joint pain.  Skin: Negative.  Negative for itching and rash.  Neurological:  Negative for dizziness, tingling, focal weakness, weakness and headaches.  Endo/Heme/Allergies:  Bruises/bleeds easily.  Psychiatric/Behavioral:  Negative for depression. The patient is not nervous/anxious and does not have insomnia.      MEDICAL HISTORY:  Past Medical History:  Diagnosis Date   Asthma    Colitis due to Clostridium difficile 2012   CTS (carpal tunnel syndrome)    Diverticulitis    GERD  (gastroesophageal reflux disease)    HOH (hard of hearing)    Hyperlipidemia    Hypertension    Seasonal allergies     SURGICAL HISTORY: Past Surgical History:  Procedure Laterality Date   CARPAL TUNNEL RELEASE Right    CHOLECYSTECTOMY     COLONOSCOPY WITH PROPOFOL N/A 01/03/2020   Procedure: COLONOSCOPY WITH PROPOFOL;  Surgeon: Regis Bill, MD;  Location: [facility] ENDOSCOPY;  Service: Endoscopy;  Laterality: N/A;   NOSE SURGERY     FOR DEVIATED SEPTUM   SHOULDER SURGERY Left     SOCIAL HISTORY: Social History   Socioeconomic History   Marital status: Married    Spouse name: Not on file   Number of children: Not on file   Years of education: Not on file   Highest education level: Not on file  Occupational History   Not on file  Tobacco Use   Smoking status: Former   Smokeless tobacco: Never  Vaping Use   Vaping status: Never Used  Substance and Sexual Activity   Alcohol use: Yes    Comment: RARE   Drug use: Never   Sexual activity: Not on file  Other Topics Concern   Not on file  Social History Narrative   Not on file   Social Drivers of Health   Financial Resource Strain: Low Risk  (04/01/2023)   Received from [facility]   Overall Financial Resource Strain (CARDIA)    Difficulty of Paying Living Expenses: Not hard at all  Food Insecurity: No Food Insecurity (04/01/2023)   Received from [facility]   Hunger Vital Sign    Worried About Running Out of Food in the Last Year: Never  true    Ran Out of Food in the Last Year: Never true  Transportation Needs: No Transportation Needs (04/01/2023)   Received from [facility] - Transportation    In the past 12 months, has lack of transportation kept you from medical appointments or from getting medications?: No    Lack of Transportation (Non-Medical): No  Physical Activity: Not on file  Stress: Not on file  Social Connections: Not on file  Intimate  Partner Violence: Not At Risk (02/26/2023)   Humiliation, Afraid, Rape, and Kick questionnaire    Fear of Current or Ex-Partner: No    Emotionally Abused: No    Physically Abused: No    Sexually Abused: No    FAMILY HISTORY: History reviewed. No pertinent family history.  ALLERGIES:  is allergic to ciprofloxacin, clarithromycin, codeine, codeine sulfate, naprosyn [naproxen], amoxicillin-pot clavulanate, and erythromycin.  MEDICATIONS:  Current Outpatient Medications  Medication Sig Dispense Refill   allopurinol (ZYLOPRIM) 100 MG tablet Take 200 mg by mouth daily.      amLODipine (NORVASC) 5 MG tablet Take 1 tablet (5 mg total) by mouth daily. 30 tablet 0   cephALEXin (KEFLEX) 500 MG capsule Take 1 capsule (500 mg total) by mouth 3 (three) times daily. 30 capsule 0   mesalamine (APRISO) 0.375 g 24 hr capsule Take 1,500 mg by mouth daily.     Multiple Vitamin (MULTI-VITAMIN) tablet Take 1 tablet by mouth.      No current facility-administered medications for this visit.    PHYSICAL EXAMINATION:  Vitals:   07/23/23 0919  BP: (!) 157/56  Pulse: 65  Temp: 98.5 F (36.9 C)    Filed Weights   07/23/23 0919  Weight: 122 lb (55.3 kg)   redness of his scrotum;  Physical Exam Vitals and nursing note reviewed.  HENT:     Head: Normocephalic and atraumatic.     Mouth/Throat:     Pharynx: Oropharynx is clear.  Eyes:     Extraocular Movements: Extraocular movements intact.     Pupils: Pupils are equal, round, and reactive to light.  Cardiovascular:     Rate and Rhythm: Normal rate and regular rhythm.  Pulmonary:     Comments: Decreased breath sounds bilaterally.  Abdominal:     Palpations: Abdomen is soft.  Musculoskeletal:        General: Normal range of motion.     Cervical back: Normal range of motion.  Skin:    General: Skin is warm.  Neurological:     General: No focal deficit present.     Mental Status: He is alert and oriented to person, place, and time.   Psychiatric:        Behavior: Behavior normal.        Judgment: Judgment normal.      LABORATORY DATA:  I have reviewed the data as listed Lab Results  Component Value Date   WBC 5.5 07/23/2023   HGB 8.6 (L) 07/23/2023   HCT 26.9 (L) 07/23/2023   MCV 108.5 (H) 07/23/2023   PLT 132 (L) 07/23/2023   Recent Labs    02/25/23 1952 02/25/23 2308 02/26/23 0452 02/26/23 1208 03/18/23 0920 07/23/23 0901  NA 134*   < > 135  --  134* 137  K 6.0*   < > 5.5* 4.9 4.5 4.8  CL 110   < > 110  --  108 112*  CO2 18*   < > 20*  --  21* 19*  GLUCOSE  225*   < > 90  --  143* 175*  BUN 43*   < > 39*  --  39* 47*  CREATININE 1.52*   < > 1.35*  --  1.60* 1.63*  CALCIUM 8.3*   < > 8.7*  --  8.9 8.7*  GFRNONAA 43*   < > 49*  --  40* 39*  PROT 5.8*  --  5.5*  --  6.4* 6.2*  ALBUMIN 3.6  --  3.6  --  4.0 3.9  AST 79*  --  63*  --  33 26  ALT 44  --  52*  --  22 14  ALKPHOS 19*  --  62  --  81 75  BILITOT 0.5  --  0.9  --  0.8 0.6  BILIDIR 0.3*  --   --   --   --   --   IBILI 0.2*  --   --   --   --   --    < > = values in this interval not displayed.    RADIOGRAPHIC STUDIES: I have personally reviewed the radiological images as listed and agreed with the findings in the report. No results found.  Thrombocytopenia (HCC) # Anemia/thrombocytopenia [at least since 2021]-hemoglobin 9-10 MCV 107; platelets 120-slowly trending down since 2021.  June 2024-  No acute sonographic etiology for thrombocytosis is identified.  Heterogeneous hepatic echogenicity, nonspecific- still Clinically suspicious for low-grade MDS.  MM panel; K/L light chains- WNL; B12- levels; No Alcohol;   # Discussed the role of a bone marrow biopsy for further evaluation of hematologic disorder like MDS.  Discussed with the patient the bone marrow biopsy and aspiration indication and procedure at length.  Given significant discomfort involved-I would recommend under anesthesia/with radiology in the hospital. I discussed the  potential complications include-bleeding/trauma and risk of infection; which are fortunately very rare.  Patient is in agreement. Patient will sign the consent prior to the procedure. Bone marrow biopsy/aspiration is ordered.  I also discussed the option of shots/ retacrit-pills based on results of the bone marrow biopsy.   # Worsening baseline anemia/ GMW:NUUVOZDG gentle iron [iron biglycinate; 28 mg ] 1 pill a day- sep Iron sat-33%.  #SEP 2024-  Recent episode of hyperkalemia- ? Dehydration/ARB - improved K today 4.5.   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   # Stage III CKD- stable.   # scrotal cellulitis- recommend kelfex 500 mg TID x10 days.    # DISPOSITION:  # Bone marrow Biopsy- in 1 week- # follow up in 3 weeks- MD; labs CBC CMP LDH- possible Retacrit or venofer-  Dr.B    All questions were answered. The patient knows to call the clinic with any problems, questions or concerns.    Earna Coder, MD 07/23/2023 12:04 PM

## 2023-07-23 NOTE — Telephone Encounter (Signed)
Called pt to verify Bone marrow Biopsy Appt; Monday Feb 10 th Arrival time is 7:30 am. [facility]. Nothing to eat or drink after midnight. Wife verified appt and she will be driving her husband.

## 2023-07-31 NOTE — Progress Notes (Signed)
 Patient for IR Bone Marrow Biopsy on Monday 08/04/2023, I called and spoke with the patient's wife, Colin Decker on the phone and gave pre-procedure instructions. Colin Decker was made aware to have the patient here at 8:30a, NPO after MN prior to procedure as well as driver post procedure/recovery/discharge. Colin Decker stated understanding.  Called 07/31/2023

## 2023-08-01 ENCOUNTER — Other Ambulatory Visit: Payer: Self-pay | Admitting: Radiology

## 2023-08-01 DIAGNOSIS — D696 Thrombocytopenia, unspecified: Secondary | ICD-10-CM

## 2023-08-01 NOTE — H&P (Signed)
 Chief Complaint: Patient was seen in consultation today for thrombocytopenia   Procedure: Bone marrow biopsy and aspiration  Referring Physician(s): Gwyn Leos  Supervising Physician: Elene Griffes  Patient Status: [facility] - Out-pt  History of Present Illness: Colin Decker is a 89 y.o. male with a history of GERD, diverticulitis, HTN, HLD, and CKD who initially presented oncology with concerns for persistent anemia/thrombocytopenia that has been slowly downtrending since 2021. Abdominal US  in July 2024 with no etiology for thrombocytopenia prompting concerns for low-grade MDS. Patient has been attempting iron supplementation without any improvement. Patient referred to IR for bone marrow biopsy to further evaluate for any hematologic disorders.   Patient is currently resting in bed with his wife, Colin Decker, at the bedside. Admits to some fatigue that is unchanged from his baseline, but otherwise without complaints. NPO since midnight.   Code Status: Full code per pt  Past Medical History:  Diagnosis Date   Asthma    Colitis due to Clostridium difficile 2012   CTS (carpal tunnel syndrome)    Diverticulitis    GERD (gastroesophageal reflux disease)    HOH (hard of hearing)    Hyperlipidemia    Hypertension    Seasonal allergies     Past Surgical History:  Procedure Laterality Date   CARPAL TUNNEL RELEASE Right    CHOLECYSTECTOMY     COLONOSCOPY WITH PROPOFOL  N/A 01/03/2020   Procedure: COLONOSCOPY WITH PROPOFOL ;  Surgeon: Shane Darling, MD;  Location: [facility] ENDOSCOPY;  Service: Endoscopy;  Laterality: N/A;   NOSE SURGERY     FOR DEVIATED SEPTUM   SHOULDER SURGERY Left     Allergies: Ciprofloxacin, Clarithromycin, Codeine, Codeine sulfate, Naprosyn [naproxen], Amoxicillin-pot clavulanate, and Erythromycin   Medications: Prior to Admission medications   Medication Sig Start Date End Date Taking? Authorizing Provider  allopurinol  (ZYLOPRIM ) 100 MG tablet  Take 200 mg by mouth daily.  12/09/18   [provider]  amLODipine  (NORVASC ) 5 MG tablet Take 1 tablet (5 mg total) by mouth daily. 02/26/23   Tiajuana Fluke, MD  cephALEXin  (KEFLEX ) 500 MG capsule Take 1 capsule (500 mg total) by mouth 3 (three) times daily. 07/23/23   Brahmanday, Govinda R, MD  mesalamine  (APRISO ) 0.375 g 24 hr capsule Take 1,500 mg by mouth daily. 10/26/21   [provider]  Multiple Vitamin (MULTI-VITAMIN) tablet Take 1 tablet by mouth.     [provider]  calcium  carbonate (OS-CAL - DOSED IN MG OF ELEMENTAL CALCIUM ) 1250 (500 Ca) MG tablet Take 0.5 tablets by mouth.  12/21/18  [provider]  chlorthalidone (HYGROTON) 25 MG tablet Take 25 mg by mouth daily.  12/21/18  [provider]  febuxostat (ULORIC) 40 MG tablet Take 40 mg by mouth daily.  12/21/18  [provider]  lisinopril (PRINIVIL,ZESTRIL) 20 MG tablet Take 20 mg by mouth daily.  12/21/18  [provider]  RABEprazole (ACIPHEX) 20 MG tablet Take 20 mg by mouth daily.  12/21/18  [provider]     No family history on file.  Social History   Socioeconomic History   Marital status: Married    Spouse name: Not on file   Number of children: Not on file   Years of education: Not on file   Highest education level: Not on file  Occupational History   Not on file  Tobacco Use   Smoking status: Former   Smokeless tobacco: Never  Vaping Use   Vaping status: Never Used  Substance and Sexual  Activity   Alcohol use: Yes    Comment: RARE   Drug use: Never   Sexual activity: Not on file  Other Topics Concern   Not on file  Social History Narrative   Not on file   Social Drivers of Health   Financial Resource Strain: Low Risk  (04/01/2023)   Received from [facility]   Overall Financial Resource Strain (CARDIA)    Difficulty of Paying Living Expenses: Not hard at all  Food Insecurity: No Food Insecurity (04/01/2023)    Received from [facility]   Hunger Vital Sign    Ran Out of Food in the Last Year: Never true    Worried About Running Out of Food in the Last Year: Never true  Transportation Needs: No Transportation Needs (04/01/2023)   Received from [facility]   PRAPARE - Transportation    Lack of Transportation (Non-Medical): No    In the past 12 months, has lack of transportation kept you from medical appointments or from getting medications?: No  Physical Activity: Not on file  Stress: Not on file  Social Connections: Not on file    Review of Systems  Constitutional:  Positive for fatigue.  Denies any N/V, chest pain, shortness of breath, fevers/chills. All other ROS negative.  Vital Signs: There were no vitals taken for this visit.  Advance Care Plan: The advanced care plan/surrogate decision maker was discussed at the time of visit and documented in the medical record.    Physical Exam Vitals reviewed.  Constitutional:      Appearance: Normal appearance.  HENT:     Head: Normocephalic and atraumatic.     Mouth/Throat:     Mouth: Mucous membranes are moist.     Pharynx: Oropharynx is clear.  Cardiovascular:     Rate and Rhythm: Normal rate and regular rhythm.     Heart sounds: Normal heart sounds.  Pulmonary:     Effort: Pulmonary effort is normal.     Breath sounds: Normal breath sounds.  Abdominal:     General: Abdomen is flat. Bowel sounds are normal.     Tenderness: There is no abdominal tenderness.  Musculoskeletal:        General: Normal range of motion.     Cervical back: Normal range of motion and neck supple.  Skin:    General: Skin is warm and dry.  Neurological:     Mental Status: He is alert. Mental status is at baseline.  Psychiatric:        Mood and Affect: Mood normal.        Behavior: Behavior normal.    Imaging: No results found.  Labs:  CBC: Recent Labs    02/25/23 1727 02/26/23 0452 03/18/23 0920  07/23/23 0900  WBC 4.6 6.0 4.5 5.5  HGB 8.4* 7.5* 8.5* 8.6*  HCT 26.7* 22.6* 25.8* 26.9*  PLT 113* 107* 141* 132*    COAGS: Recent Labs    11/26/22 1437  INR 1.1  APTT 30    BMP: Recent Labs    02/25/23 2308 02/26/23 0452 02/26/23 1208 03/18/23 0920 07/23/23 0901  NA 136 135  --  134* 137  K 4.8 5.5* 4.9 4.5 4.8  CL 111 110  --  108 112*  CO2 18* 20*  --  21* 19*  GLUCOSE 86 90  --  143* 175*  BUN 39* 39*  --  39* 47*  CALCIUM  8.8* 8.7*  --  8.9 8.7*  CREATININE 1.40* 1.35*  --  1.60* 1.63*  GFRNONAA 47* 49*  --  40* 39*    LIVER FUNCTION TESTS: Recent Labs    02/25/23 1952 02/26/23 0452 03/18/23 0920 07/23/23 0901  BILITOT 0.5 0.9 0.8 0.6  AST 79* 63* 33 26  ALT 44 52* 22 14  ALKPHOS 19* 62 81 75  PROT 5.8* 5.5* 6.4* 6.2*  ALBUMIN 3.6 3.6 4.0 3.9    TUMOR MARKERS: No results for input(s): "AFPTM", "CEA", "CA199", "CHROMGRNA" in the last 8760 hours.  Assessment and Plan:  89 y.o. male with a history of GERD, diverticulitis, HTN, HLD, and CKD who initially presented oncology with concerns for persistent anemia/thrombocytopenia that has been slowly downtrending since 2021. Abdominal US  in July 2024 with no etiology for thrombocytopenia prompting concerns for low-grade MDS. Patient has been attempting iron supplementation without any improvement. Patient referred to IR for bone marrow biopsy to further evaluate for any hematologic disorders.  Plan for bone marrow biopsy on 08/04/23 with Dr. Irine Manning   Risks and benefits of bone marrow biopsy was discussed with the patient and/or patient's family including, but not limited to bleeding, infection, damage to adjacent structures or low yield requiring additional tests.  All of the questions were answered and there is agreement to proceed.  Consent signed and in chart.   Thank you for this interesting consult. I greatly enjoyed meeting JADIAH GENOVESE and look forward to participating in their care. A copy of  this report was sent to the requesting provider on this date.  Electronically Signed: Orson Blalock, PA-C 08/01/2023, 11:23 AM   I spent a total of  30 Minutes in face to face clinical consultation, greater than 50% of which was counseling/coordinating care for bone marrow biopsy.

## 2023-08-04 ENCOUNTER — Encounter: Payer: Self-pay | Admitting: Radiology

## 2023-08-04 ENCOUNTER — Ambulatory Visit
Admission: RE | Admit: 2023-08-04 | Discharge: 2023-08-04 | Disposition: A | Discharge status: Home / Self Care | Payer: Medicare Other | Source: Ambulatory Visit | Attending: Internal Medicine | Admitting: Internal Medicine

## 2023-08-04 ENCOUNTER — Other Ambulatory Visit: Payer: Self-pay

## 2023-08-04 DIAGNOSIS — I129 Hypertensive chronic kidney disease with stage 1 through stage 4 chronic kidney disease, or unspecified chronic kidney disease: Secondary | ICD-10-CM | POA: Diagnosis not present

## 2023-08-04 DIAGNOSIS — D696 Thrombocytopenia, unspecified: Secondary | ICD-10-CM | POA: Insufficient documentation

## 2023-08-04 DIAGNOSIS — N189 Chronic kidney disease, unspecified: Secondary | ICD-10-CM | POA: Insufficient documentation

## 2023-08-04 DIAGNOSIS — K219 Gastro-esophageal reflux disease without esophagitis: Secondary | ICD-10-CM | POA: Diagnosis not present

## 2023-08-04 DIAGNOSIS — E785 Hyperlipidemia, unspecified: Secondary | ICD-10-CM | POA: Insufficient documentation

## 2023-08-04 DIAGNOSIS — D649 Anemia, unspecified: Secondary | ICD-10-CM | POA: Diagnosis not present

## 2023-08-04 HISTORY — PX: IR BONE MARROW BIOPSY & ASPIRATION: IMG5727

## 2023-08-04 LAB — CBC WITH DIFFERENTIAL/PLATELET
Abs Immature Granulocytes: 0.06 10*3/uL (ref 0.00–0.07)
Basophils Absolute: 0.1 10*3/uL (ref 0.0–0.1)
Basophils Relative: 1 %
Eosinophils Absolute: 0 10*3/uL (ref 0.0–0.5)
Eosinophils Relative: 0 %
HCT: 27.8 % — ABNORMAL LOW (ref 39.0–52.0)
Hemoglobin: 9 g/dL — ABNORMAL LOW (ref 13.0–17.0)
Immature Granulocytes: 1 %
Lymphocytes Relative: 26 %
Lymphs Abs: 1.4 10*3/uL (ref 0.7–4.0)
MCH: 35.2 pg — ABNORMAL HIGH (ref 26.0–34.0)
MCHC: 32.4 g/dL (ref 30.0–36.0)
MCV: 108.6 fL — ABNORMAL HIGH (ref 80.0–100.0)
Monocytes Absolute: 0.6 10*3/uL (ref 0.1–1.0)
Monocytes Relative: 10 %
Neutro Abs: 3.3 10*3/uL (ref 1.7–7.7)
Neutrophils Relative %: 62 %
Platelets: 128 10*3/uL — ABNORMAL LOW (ref 150–400)
RBC: 2.56 MIL/uL — ABNORMAL LOW (ref 4.22–5.81)
RDW: 14 % (ref 11.5–15.5)
WBC: 5.3 10*3/uL (ref 4.0–10.5)
nRBC: 0 % (ref 0.0–0.2)

## 2023-08-04 MED ORDER — MIDAZOLAM HCL 2 MG/2ML IJ SOLN
INTRAMUSCULAR | Status: AC | PRN
  Administered 2023-08-04: 1 mg via INTRAVENOUS

## 2023-08-04 MED ORDER — FENTANYL CITRATE (PF) 100 MCG/2ML IJ SOLN
INTRAMUSCULAR | Status: AC | PRN
  Administered 2023-08-04: 25 ug via INTRAVENOUS

## 2023-08-04 MED ORDER — HEPARIN SOD (PORK) LOCK FLUSH 100 UNIT/ML IV SOLN
INTRAVENOUS | Status: AC
  Filled 2023-08-04: qty 5

## 2023-08-04 MED ORDER — LIDOCAINE 1 % OPTIME INJ - NO CHARGE
10.0000 mL | Freq: Once | INTRAMUSCULAR | Status: AC
  Administered 2023-08-04: 10 mL via INTRADERMAL
  Filled 2023-08-04: qty 10

## 2023-08-04 MED ORDER — SODIUM CHLORIDE 0.9 % IV SOLN: INTRAVENOUS | Status: DC

## 2023-08-04 MED ORDER — FENTANYL CITRATE (PF) 100 MCG/2ML IJ SOLN
INTRAMUSCULAR | Status: AC
  Filled 2023-08-04: qty 2

## 2023-08-04 MED ORDER — LIDOCAINE HCL 1 % IJ SOLN: 10.0000 mL | Freq: Once | INTRAMUSCULAR | Status: DC

## 2023-08-04 MED ORDER — MIDAZOLAM HCL 2 MG/2ML IJ SOLN
INTRAMUSCULAR | Status: AC
  Filled 2023-08-04: qty 2

## 2023-08-04 NOTE — Procedures (Signed)
 Interventional Radiology Procedure:   Indications: Thrombocytopenia  Procedure: Image guided bone marrow biopsy  Findings: 2 aspirates and 2 cores from right ilium  Complications: None     EBL: Minimal, less than 10 ml  Plan: Discharge to home in one hour.   Colin Knee R. Julietta Ogren, MD  Pager: [phone]

## 2023-08-05 ENCOUNTER — Other Ambulatory Visit: Payer: Self-pay | Admitting: Internal Medicine

## 2023-08-06 LAB — SURGICAL PATHOLOGY

## 2023-08-12 ENCOUNTER — Encounter (HOSPITAL_COMMUNITY): Payer: Self-pay | Admitting: Internal Medicine

## 2023-08-14 ENCOUNTER — Ambulatory Visit: Payer: Medicare Other

## 2023-08-14 ENCOUNTER — Ambulatory Visit: Payer: Medicare Other | Admitting: Internal Medicine

## 2023-08-14 ENCOUNTER — Other Ambulatory Visit: Payer: Medicare Other

## 2023-08-15 ENCOUNTER — Inpatient Hospital Stay: Payer: Medicare Other | Attending: Internal Medicine

## 2023-08-15 ENCOUNTER — Other Ambulatory Visit: Payer: Self-pay | Admitting: Internal Medicine

## 2023-08-15 ENCOUNTER — Telehealth: Payer: Self-pay | Admitting: Internal Medicine

## 2023-08-15 DIAGNOSIS — D631 Anemia in chronic kidney disease: Secondary | ICD-10-CM | POA: Insufficient documentation

## 2023-08-15 DIAGNOSIS — D696 Thrombocytopenia, unspecified: Secondary | ICD-10-CM | POA: Insufficient documentation

## 2023-08-15 DIAGNOSIS — D649 Anemia, unspecified: Secondary | ICD-10-CM

## 2023-08-15 DIAGNOSIS — N183 Chronic kidney disease, stage 3 unspecified: Secondary | ICD-10-CM | POA: Insufficient documentation

## 2023-08-15 DIAGNOSIS — N492 Inflammatory disorders of scrotum: Secondary | ICD-10-CM | POA: Diagnosis not present

## 2023-08-15 DIAGNOSIS — Z87891 Personal history of nicotine dependence: Secondary | ICD-10-CM | POA: Diagnosis not present

## 2023-08-15 DIAGNOSIS — K529 Noninfective gastroenteritis and colitis, unspecified: Secondary | ICD-10-CM | POA: Diagnosis not present

## 2023-08-15 LAB — CBC WITH DIFFERENTIAL (CANCER CENTER ONLY)
Abs Immature Granulocytes: 0.01 10*3/uL (ref 0.00–0.07)
Basophils Absolute: 0 10*3/uL (ref 0.0–0.1)
Basophils Relative: 1 %
Eosinophils Absolute: 0 10*3/uL (ref 0.0–0.5)
Eosinophils Relative: 0 %
HCT: 25.8 % — ABNORMAL LOW (ref 39.0–52.0)
Hemoglobin: 8.4 g/dL — ABNORMAL LOW (ref 13.0–17.0)
Immature Granulocytes: 0 %
Lymphocytes Relative: 24 %
Lymphs Abs: 1.1 10*3/uL (ref 0.7–4.0)
MCH: 35.1 pg — ABNORMAL HIGH (ref 26.0–34.0)
MCHC: 32.6 g/dL (ref 30.0–36.0)
MCV: 107.9 fL — ABNORMAL HIGH (ref 80.0–100.0)
Monocytes Absolute: 0.4 10*3/uL (ref 0.1–1.0)
Monocytes Relative: 10 %
Neutro Abs: 3 10*3/uL (ref 1.7–7.7)
Neutrophils Relative %: 65 %
Platelet Count: 130 10*3/uL — ABNORMAL LOW (ref 150–400)
RBC: 2.39 MIL/uL — ABNORMAL LOW (ref 4.22–5.81)
RDW: 13.8 % (ref 11.5–15.5)
WBC Count: 4.5 10*3/uL (ref 4.0–10.5)
nRBC: 0 % (ref 0.0–0.2)

## 2023-08-15 LAB — CMP (CANCER CENTER ONLY)
ALT: 18 U/L (ref 0–44)
AST: 27 U/L (ref 15–41)
Albumin: 3.7 g/dL (ref 3.5–5.0)
Alkaline Phosphatase: 77 U/L (ref 38–126)
Anion gap: 5 (ref 5–15)
BUN: 50 mg/dL — ABNORMAL HIGH (ref 8–23)
CO2: 18 mmol/L — ABNORMAL LOW (ref 22–32)
Calcium: 8.7 mg/dL — ABNORMAL LOW (ref 8.9–10.3)
Chloride: 116 mmol/L — ABNORMAL HIGH (ref 98–111)
Creatinine: 1.69 mg/dL — ABNORMAL HIGH (ref 0.61–1.24)
GFR, Estimated: 37 mL/min — ABNORMAL LOW (ref >60)
Glucose, Bld: 102 mg/dL — ABNORMAL HIGH (ref 70–99)
Potassium: 5.5 mmol/L — ABNORMAL HIGH (ref 3.5–5.1)
Sodium: 139 mmol/L (ref 135–145)
Total Bilirubin: 0.6 mg/dL (ref 0.0–1.2)
Total Protein: 6.2 g/dL — ABNORMAL LOW (ref 6.5–8.1)

## 2023-08-15 LAB — LACTATE DEHYDROGENASE: LDH: 160 U/L (ref 98–192)

## 2023-08-15 NOTE — Progress Notes (Signed)
 K- 5.5- otherw ise stable- chronic intermittent-

## 2023-08-15 NOTE — Telephone Encounter (Signed)
 I have ordered Intelli- myeloid panel- please make sure that's drawn on next visit-

## 2023-08-20 ENCOUNTER — Telehealth: Payer: Self-pay | Admitting: *Deleted

## 2023-08-20 ENCOUNTER — Inpatient Hospital Stay: Payer: Medicare Other

## 2023-08-20 ENCOUNTER — Encounter: Payer: Self-pay | Admitting: Internal Medicine

## 2023-08-20 ENCOUNTER — Other Ambulatory Visit: Payer: Self-pay

## 2023-08-20 ENCOUNTER — Inpatient Hospital Stay (HOSPITAL_BASED_OUTPATIENT_CLINIC_OR_DEPARTMENT_OTHER): Payer: Medicare Other | Admitting: Internal Medicine

## 2023-08-20 VITALS — BP 123/49 | HR 59 | Temp 98.3°F | Ht 60.0 in | Wt 120.6 lb

## 2023-08-20 DIAGNOSIS — D649 Anemia, unspecified: Secondary | ICD-10-CM

## 2023-08-20 DIAGNOSIS — N183 Chronic kidney disease, stage 3 unspecified: Secondary | ICD-10-CM | POA: Diagnosis not present

## 2023-08-20 LAB — BASIC METABOLIC PANEL - CANCER CENTER ONLY
Anion gap: 7 (ref 5–15)
BUN: 42 mg/dL — ABNORMAL HIGH (ref 8–23)
CO2: 16 mmol/L — ABNORMAL LOW (ref 22–32)
Calcium: 8.6 mg/dL — ABNORMAL LOW (ref 8.9–10.3)
Chloride: 113 mmol/L — ABNORMAL HIGH (ref 98–111)
Creatinine: 1.82 mg/dL — ABNORMAL HIGH (ref 0.61–1.24)
GFR, Estimated: 34 mL/min — ABNORMAL LOW (ref >60)
Glucose, Bld: 116 mg/dL — ABNORMAL HIGH (ref 70–99)
Potassium: 5.6 mmol/L — ABNORMAL HIGH (ref 3.5–5.1)
Sodium: 136 mmol/L (ref 135–145)

## 2023-08-20 MED ORDER — EPOETIN ALFA-EPBX 20000 UNIT/ML IJ SOLN
20000.0000 [IU] | Freq: Once | INTRAMUSCULAR | Status: AC
  Administered 2023-08-20: 20000 [IU] via SUBCUTANEOUS

## 2023-08-20 NOTE — Progress Notes (Signed)
 Would like copy of labs and bone marrow biopsy.  Appetite 50% normal, no supplement drinks.

## 2023-08-20 NOTE — Assessment & Plan Note (Addendum)
#   Anemia/thrombocytopenia [at least since 2021]-hemoglobin 9-10 MCV 107; platelets 120-slowly trending down since 2021.  June 2024-  No acute sonographic etiology for thrombocytosis is identified. FEB 2025- ONE MARROW, ASPIRATE, CLOT, CORE:  Overall normocellular bone marrow with otherwise orderly trilineage hematopoiesis.  Cytogenetics- NEG. Intelli Myeloid panel-Penidng.   # Worsening baseline anemia/ ZOX:WRUEAVWU gentle iron [iron biglycinate; 28 mg ] 1 pill a day- sep Iron sat-33%; recommend Retacrit/Venofer. Proceed with Retacrit.   # CKD stage IV/intermittent hyperkalemia- ? 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   # DISPOSITION:  # NO venofer;  ok with retacrit #  2 weeks-labs- H&H-possible retacrit # 4 weeks-labs- H&H-possible retacrit # follow up in 6  weeks- MD; labs CBC CMP LDH;  iron studies; ferritin-  possible Retacrit or venofer-  Dr.B

## 2023-08-20 NOTE — Progress Notes (Signed)
 [facility] CONSULT NOTE  Patient Care Team: Marisue Ivan, MD as PCP - General (Family Medicine) Colin Coder, MD as Consulting Physician (Oncology)  CHIEF COMPLAINTS/PURPOSE OF CONSULTATION: Thrombocytopenia/anemia  HISTORY OF PRESENTING ILLNESS: Patient ambulating-independently.  Accompanied by wife.   Colin Decker 89 y.o.  male with no significant past medical history except for mild colitis; CKD stage III ? MDS with thrombocytopenia/anemia is here to review the results of the bone marrow iis here for a follow up.  S/p antibiotics for scrotal cellulitis- improved.    Appetite 50% normal, no supplement drink.  Patient has had no recent hospitalizations. Complains of worsening fatigue. No in stool; or black colored stools. Patient admits to easy bruising although only on mild trauma.  No spontaneous bleeding.    Review of Systems  Constitutional:  Positive for malaise/fatigue. Negative for chills, diaphoresis, fever and weight loss.  HENT:  Negative for nosebleeds and sore throat.   Eyes:  Negative for double vision.  Respiratory:  Negative for cough, hemoptysis, sputum production, shortness of breath and wheezing.   Cardiovascular:  Negative for chest pain, palpitations, orthopnea and leg swelling.  Gastrointestinal:  Negative for abdominal pain, blood in stool, constipation, diarrhea, heartburn, melena, nausea and vomiting.  Genitourinary:  Negative for dysuria, frequency and urgency.  Musculoskeletal:  Negative for back pain and joint pain.  Skin: Negative.  Negative for itching and rash.  Neurological:  Negative for dizziness, tingling, focal weakness, weakness and headaches.  Endo/Heme/Allergies:  Bruises/bleeds easily.  Psychiatric/Behavioral:  Negative for depression. The patient is not nervous/anxious and does not have insomnia.      MEDICAL HISTORY:  Past Medical History:  Diagnosis Date   Asthma    Colitis due to Clostridium difficile  2012   CTS (carpal tunnel syndrome)    Diverticulitis    GERD (gastroesophageal reflux disease)    HOH (hard of hearing)    Hyperlipidemia    Hypertension    Seasonal allergies     SURGICAL HISTORY: Past Surgical History:  Procedure Laterality Date   CARPAL TUNNEL RELEASE Right    CHOLECYSTECTOMY     COLONOSCOPY WITH PROPOFOL N/A 01/03/2020   Procedure: COLONOSCOPY WITH PROPOFOL;  Surgeon: Regis Bill, MD;  Location: [facility] ENDOSCOPY;  Service: Endoscopy;  Laterality: N/A;   IR BONE MARROW BIOPSY & ASPIRATION  08/04/2023   NOSE SURGERY     FOR DEVIATED SEPTUM   SHOULDER SURGERY Left     SOCIAL HISTORY: Social History   Socioeconomic History   Marital status: Married    Spouse name: [name]   Number of children: 2   Years of education: Not on file   Highest education level: Not on file  Occupational History   Not on file  Tobacco Use   Smoking status: Former   Smokeless tobacco: Never  Vaping Use   Vaping status: Never Used  Substance and Sexual Activity   Alcohol use: Yes    Comment: RARE   Drug use: Never   Sexual activity: Not on file  Other Topics Concern   Not on file  Social History Narrative   Lives with wife [name] in a home. 2 sons    Social Drivers of Corporate investment banker Strain: Low Risk  (04/01/2023)   Received from [facility]   Overall Financial Resource Strain (CARDIA)    Difficulty of Paying Living Expenses: Not hard at all  Food Insecurity: No Food Insecurity (04/01/2023)   Received from  [facility]   Hunger Vital Sign    Ran Out of Food in the Last Year: Never true    Worried About Running Out of Food in the Last Year: Never true  Transportation Needs: No Transportation Needs (04/01/2023)   Received from [facility]   PRAPARE - Transportation    Lack of Transportation (Non-Medical): No    In the past 12 months, has lack of transportation kept you from medical appointments or from  getting medications?: No  Physical Activity: Not on file  Stress: Not on file  Social Connections: Not on file  Intimate Partner Violence: Not At Risk (02/26/2023)   Humiliation, Afraid, Rape, and Kick questionnaire    Fear of Current or Ex-Partner: No    Emotionally Abused: No    Physically Abused: No    Sexually Abused: No    FAMILY HISTORY: History reviewed. No pertinent family history.  ALLERGIES:  is allergic to ciprofloxacin, clarithromycin, codeine, codeine sulfate, naprosyn [naproxen], amoxicillin-pot clavulanate, and erythromycin.  MEDICATIONS:  Current Outpatient Medications  Medication Sig Dispense Refill   allopurinol (ZYLOPRIM) 100 MG tablet Take 200 mg by mouth daily.      amLODipine (NORVASC) 5 MG tablet Take 1 tablet (5 mg total) by mouth daily. 30 tablet 0   cephALEXin (KEFLEX) 500 MG capsule Take 1 capsule (500 mg total) by mouth 3 (three) times daily. 30 capsule 0   mesalamine (APRISO) 0.375 g 24 hr capsule Take 1,500 mg by mouth daily.     Multiple Vitamin (MULTI-VITAMIN) tablet Take 1 tablet by mouth.      No current facility-administered medications for this visit.    PHYSICAL EXAMINATION:  Vitals:   08/20/23 1427  BP: (!) 123/49  Pulse: (!) 59  Temp: 98.3 F (36.8 C)  SpO2: 99%     Filed Weights   08/20/23 1427  Weight: 120 lb 9.6 oz (54.7 kg)     Physical Exam Vitals and nursing note reviewed.  HENT:     Head: Normocephalic and atraumatic.     Mouth/Throat:     Pharynx: Oropharynx is clear.  Eyes:     Extraocular Movements: Extraocular movements intact.     Pupils: Pupils are equal, round, and reactive to light.  Cardiovascular:     Rate and Rhythm: Normal rate and regular rhythm.  Pulmonary:     Comments: Decreased breath sounds bilaterally.  Abdominal:     Palpations: Abdomen is soft.  Musculoskeletal:        General: Normal range of motion.     Cervical back: Normal range of motion.  Skin:    General: Skin is warm.   Neurological:     General: No focal deficit present.     Mental Status: He is alert and oriented to person, place, and time.  Psychiatric:        Behavior: Behavior normal.        Judgment: Judgment normal.      LABORATORY DATA:  I have reviewed the data as listed Lab Results  Component Value Date   WBC 4.5 08/15/2023   HGB 8.4 (L) 08/15/2023   HCT 25.8 (L) 08/15/2023   MCV 107.9 (H) 08/15/2023   PLT 130 (L) 08/15/2023   Recent Labs    02/25/23 1952 02/25/23 2308 03/18/23 0920 07/23/23 0901 08/15/23 0935 08/20/23 1405  NA 134*   < > 134* 137 139 136  K 6.0*   < > 4.5 4.8 5.5* 5.6*  CL 110   < >  108 112* 116* 113*  CO2 18*   < > 21* 19* 18* 16*  GLUCOSE 225*   < > 143* 175* 102* 116*  BUN 43*   < > 39* 47* 50* 42*  CREATININE 1.52*   < > 1.60* 1.63* 1.69* 1.82*  CALCIUM 8.3*   < > 8.9 8.7* 8.7* 8.6*  GFRNONAA 43*   < > 40* 39* 37* 34*  PROT 5.8*   < > 6.4* 6.2* 6.2*  --   ALBUMIN 3.6   < > 4.0 3.9 3.7  --   AST 79*   < > 33 26 27  --   ALT 44   < > 22 14 18   --   ALKPHOS 19*   < > 81 75 77  --   BILITOT 0.5   < > 0.8 0.6 0.6  --   BILIDIR 0.3*  --   --   --   --   --   IBILI 0.2*  --   --   --   --   --    < > = values in this interval not displayed.    RADIOGRAPHIC STUDIES: I have personally reviewed the radiological images as listed and agreed with the findings in the report. IR BONE MARROW BIOPSY & ASPIRATION Result Date: 08/04/2023 INDICATION: 89 year old with thrombocytopenia.  Request for bone marrow biopsy. EXAM: FLUOROSCOPIC GUIDED BONE MARROW ASPIRATES AND BIOPSY Physician: Rachelle Hora. Henn, MD MEDICATIONS: Moderate sedation ANESTHESIA/SEDATION: Moderate (conscious) sedation was employed during this procedure. A total of Versed 1mg  and fentanyl 25 mcg was administered intravenously at the order of the provider performing the procedure. Total intra-service moderate sedation time: 10 minutes. Patient's level of consciousness and vital signs were monitored  continuously by radiology nurse throughout the procedure under the supervision of the provider performing the procedure. COMPLICATIONS: None immediate. PROCEDURE: The procedure was explained to the patient. The risks and benefits of the procedure were discussed and the patient's questions were addressed. Informed consent was obtained from the patient. The patient was placed prone on interventional table. The back was prepped and draped in sterile fashion. Maximal barrier sterile technique was utilized including caps, mask, sterile gowns, sterile gloves, sterile drape, hand hygiene and skin antiseptic. The skin and right posterior ilium were anesthetized with 1% lidocaine. 11 gauge bone needle was directed into the right ilium with fluoroscopic guidance. Two aspirates and two core biopsies were obtained. Bandage placed over the puncture site. Fluoroscopic image saved for documentation. IMPRESSION: Fluoroscopic guided bone marrow aspiration and core biopsy. Electronically Signed   By: Richarda Overlie M.D.   On: 08/04/2023 11:23    Symptomatic anemia # Anemia/thrombocytopenia [at least since 2021]-hemoglobin 9-10 MCV 107; platelets 120-slowly trending down since 2021.  June 2024-  No acute sonographic etiology for thrombocytosis is identified. FEB 2025- ONE MARROW, ASPIRATE, CLOT, CORE:  Overall normocellular bone marrow with otherwise orderly trilineage hematopoiesis.  Cytogenetics- NEG. Intelli Myeloid panel-Penidng.   # Worsening baseline anemia/ ZOX:WRUEAVWU gentle iron [iron biglycinate; 28 mg ] 1 pill a day- sep Iron sat-33%; recommend Retacrit/Venofer. Proceed with Retacrit.   # CKD stage IV/intermittent hyperkalemia- ? 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   # DISPOSITION:  # NO venofer;  ok with retacrit #  2 weeks-labs- H&H-possible retacrit # 4 weeks-labs- H&H-possible retacrit # follow up in 6  weeks- MD; labs CBC CMP LDH;  iron studies;  ferritin-  possible Retacrit or venofer-  Dr.B    All questions were answered. The patient knows to call the clinic with any problems, questions or concerns.    Colin Coder, MD 09/02/2023 10:56 PM

## 2023-08-20 NOTE — Telephone Encounter (Signed)
 Called pt's wife [name] on the phone as they were not sure about whether he is taking lisinopril or not. Yes, patient is taking 5 mg daily. Regarding high potassium levels;  Dr Ardeth Perfect doctor, told Dr B it could  be diet related. Avoid potatoes, fries, chips, tomato soup/juice, orange juice. Wife will make dietary changes. I told her I will call her back tomm regarding lisinopril.

## 2023-09-03 ENCOUNTER — Inpatient Hospital Stay: Payer: Medicare Other | Attending: Internal Medicine

## 2023-09-03 ENCOUNTER — Inpatient Hospital Stay: Payer: Medicare Other

## 2023-09-03 VITALS — BP 145/56

## 2023-09-03 DIAGNOSIS — K529 Noninfective gastroenteritis and colitis, unspecified: Secondary | ICD-10-CM | POA: Diagnosis not present

## 2023-09-03 DIAGNOSIS — N184 Chronic kidney disease, stage 4 (severe): Secondary | ICD-10-CM | POA: Insufficient documentation

## 2023-09-03 DIAGNOSIS — N492 Inflammatory disorders of scrotum: Secondary | ICD-10-CM | POA: Insufficient documentation

## 2023-09-03 DIAGNOSIS — D649 Anemia, unspecified: Secondary | ICD-10-CM

## 2023-09-03 DIAGNOSIS — D696 Thrombocytopenia, unspecified: Secondary | ICD-10-CM | POA: Diagnosis not present

## 2023-09-03 DIAGNOSIS — D631 Anemia in chronic kidney disease: Secondary | ICD-10-CM | POA: Insufficient documentation

## 2023-09-03 DIAGNOSIS — Z87891 Personal history of nicotine dependence: Secondary | ICD-10-CM | POA: Insufficient documentation

## 2023-09-03 DIAGNOSIS — N183 Chronic kidney disease, stage 3 unspecified: Secondary | ICD-10-CM | POA: Diagnosis present

## 2023-09-03 LAB — HEMOGLOBIN AND HEMATOCRIT (CANCER CENTER ONLY)
HCT: 26.2 % — ABNORMAL LOW (ref 39.0–52.0)
Hemoglobin: 8.5 g/dL — ABNORMAL LOW (ref 13.0–17.0)

## 2023-09-03 MED ORDER — EPOETIN ALFA 20000 UNIT/ML IJ SOLN
20000.0000 [IU] | Freq: Once | INTRAMUSCULAR | Status: AC
  Administered 2023-09-03: 20000 [IU] via SUBCUTANEOUS
  Filled 2023-09-03: qty 1

## 2023-09-08 LAB — INTELLIGEN MYELOID

## 2023-09-17 ENCOUNTER — Inpatient Hospital Stay: Payer: Medicare Other

## 2023-09-17 VITALS — BP 136/72

## 2023-09-17 DIAGNOSIS — D649 Anemia, unspecified: Secondary | ICD-10-CM

## 2023-09-17 DIAGNOSIS — N183 Chronic kidney disease, stage 3 unspecified: Secondary | ICD-10-CM | POA: Diagnosis not present

## 2023-09-17 DIAGNOSIS — N184 Chronic kidney disease, stage 4 (severe): Secondary | ICD-10-CM | POA: Diagnosis not present

## 2023-09-17 LAB — HEMOGLOBIN AND HEMATOCRIT (CANCER CENTER ONLY)
HCT: 28.8 % — ABNORMAL LOW (ref 39.0–52.0)
Hemoglobin: 9.1 g/dL — ABNORMAL LOW (ref 13.0–17.0)

## 2023-09-17 MED ORDER — EPOETIN ALFA 20000 UNIT/ML IJ SOLN
20000.0000 [IU] | Freq: Once | INTRAMUSCULAR | Status: AC
  Administered 2023-09-17: 20000 [IU] via SUBCUTANEOUS
  Filled 2023-09-17: qty 1

## 2023-10-01 ENCOUNTER — Inpatient Hospital Stay: Payer: Medicare Other | Attending: Internal Medicine

## 2023-10-01 ENCOUNTER — Inpatient Hospital Stay: Payer: Medicare Other

## 2023-10-01 ENCOUNTER — Inpatient Hospital Stay (HOSPITAL_BASED_OUTPATIENT_CLINIC_OR_DEPARTMENT_OTHER): Payer: Medicare Other | Admitting: Internal Medicine

## 2023-10-01 DIAGNOSIS — E860 Dehydration: Secondary | ICD-10-CM | POA: Insufficient documentation

## 2023-10-01 DIAGNOSIS — D649 Anemia, unspecified: Secondary | ICD-10-CM | POA: Diagnosis not present

## 2023-10-01 DIAGNOSIS — K529 Noninfective gastroenteritis and colitis, unspecified: Secondary | ICD-10-CM | POA: Diagnosis not present

## 2023-10-01 DIAGNOSIS — D631 Anemia in chronic kidney disease: Secondary | ICD-10-CM | POA: Insufficient documentation

## 2023-10-01 DIAGNOSIS — N183 Chronic kidney disease, stage 3 unspecified: Secondary | ICD-10-CM | POA: Diagnosis present

## 2023-10-01 DIAGNOSIS — R5383 Other fatigue: Secondary | ICD-10-CM | POA: Diagnosis not present

## 2023-10-01 DIAGNOSIS — Z87891 Personal history of nicotine dependence: Secondary | ICD-10-CM | POA: Insufficient documentation

## 2023-10-01 DIAGNOSIS — N492 Inflammatory disorders of scrotum: Secondary | ICD-10-CM | POA: Insufficient documentation

## 2023-10-01 DIAGNOSIS — N184 Chronic kidney disease, stage 4 (severe): Secondary | ICD-10-CM | POA: Insufficient documentation

## 2023-10-01 DIAGNOSIS — D696 Thrombocytopenia, unspecified: Secondary | ICD-10-CM | POA: Diagnosis not present

## 2023-10-01 LAB — CMP (CANCER CENTER ONLY)
ALT: 16 U/L (ref 0–44)
AST: 29 U/L (ref 15–41)
Albumin: 4 g/dL (ref 3.5–5.0)
Alkaline Phosphatase: 80 U/L (ref 38–126)
Anion gap: 9 (ref 5–15)
BUN: 39 mg/dL — ABNORMAL HIGH (ref 8–23)
CO2: 22 mmol/L (ref 22–32)
Calcium: 8.7 mg/dL — ABNORMAL LOW (ref 8.9–10.3)
Chloride: 106 mmol/L (ref 98–111)
Creatinine: 1.71 mg/dL — ABNORMAL HIGH (ref 0.61–1.24)
GFR, Estimated: 37 mL/min — ABNORMAL LOW (ref >60)
Glucose, Bld: 101 mg/dL — ABNORMAL HIGH (ref 70–99)
Potassium: 4.3 mmol/L (ref 3.5–5.1)
Sodium: 137 mmol/L (ref 135–145)
Total Bilirubin: 0.7 mg/dL (ref 0.0–1.2)
Total Protein: 6.5 g/dL (ref 6.5–8.1)

## 2023-10-01 LAB — CBC WITH DIFFERENTIAL (CANCER CENTER ONLY)
Abs Immature Granulocytes: 0.01 10*3/uL (ref 0.00–0.07)
Basophils Absolute: 0.1 10*3/uL (ref 0.0–0.1)
Basophils Relative: 1 %
Eosinophils Absolute: 0 10*3/uL (ref 0.0–0.5)
Eosinophils Relative: 0 %
HCT: 31.2 % — ABNORMAL LOW (ref 39.0–52.0)
Hemoglobin: 9.8 g/dL — ABNORMAL LOW (ref 13.0–17.0)
Immature Granulocytes: 0 %
Lymphocytes Relative: 24 %
Lymphs Abs: 1.1 10*3/uL (ref 0.7–4.0)
MCH: 33.7 pg (ref 26.0–34.0)
MCHC: 31.4 g/dL (ref 30.0–36.0)
MCV: 107.2 fL — ABNORMAL HIGH (ref 80.0–100.0)
Monocytes Absolute: 0.5 10*3/uL (ref 0.1–1.0)
Monocytes Relative: 11 %
Neutro Abs: 2.8 10*3/uL (ref 1.7–7.7)
Neutrophils Relative %: 64 %
Platelet Count: 124 10*3/uL — ABNORMAL LOW (ref 150–400)
RBC: 2.91 MIL/uL — ABNORMAL LOW (ref 4.22–5.81)
RDW: 13.5 % (ref 11.5–15.5)
WBC Count: 4.4 10*3/uL (ref 4.0–10.5)
nRBC: 0 % (ref 0.0–0.2)

## 2023-10-01 LAB — IRON AND TIBC
Iron: 68 ug/dL (ref 45–182)
Saturation Ratios: 27 % (ref 17.9–39.5)
TIBC: 252 ug/dL (ref 250–450)
UIBC: 184 ug/dL

## 2023-10-01 LAB — LACTATE DEHYDROGENASE: LDH: 199 U/L — ABNORMAL HIGH (ref 98–192)

## 2023-10-01 LAB — FERRITIN: Ferritin: 107 ng/mL (ref 24–336)

## 2023-10-01 MED ORDER — EPOETIN ALFA 20000 UNIT/ML IJ SOLN
20000.0000 [IU] | Freq: Once | INTRAMUSCULAR | Status: AC
  Administered 2023-10-01: 20000 [IU] via SUBCUTANEOUS
  Filled 2023-10-01: qty 1

## 2023-10-01 NOTE — Assessment & Plan Note (Addendum)
#   Anemia/thrombocytopenia [at least since 2021]-MULTIFACTORIAL- low grade MDS + CKD- hemoglobin 9-10 MCV 107; platelets 120-slowly trending down since 2021. FEB 2025- BONE MARROW, ASPIRATE, CLOT, CORE:  Overall normocellular bone marrow with otherwise orderly trilineage hematopoiesis.  Cytogenetics- NEG. 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 Proceed with Retacrit.   # CKD stage IV/intermittent hyperkalemia- ? 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   # DISPOSITION:  # NO venofer;  ok with retacrit #  2 weeks-labs- H&H-possible retacrit # 4 weeks-labs- H&H-possible retacrit # 6 weeks-labs- H&H-possible retacrit # follow up in 8  weeks- MD; labs CBC CMP LDH;  iron studies; ferritin-  possible Retacrit or venofer-  Dr.B

## 2023-10-01 NOTE — Progress Notes (Signed)
 [facility] CONSULT NOTE  Patient Care Team: Marisue Ivan, MD as PCP - General (Family Medicine) Earna Coder, MD as Consulting Physician (Oncology)  CHIEF COMPLAINTS/PURPOSE OF CONSULTATION: Thrombocytopenia/anemia  HISTORY OF PRESENTING ILLNESS: Patient ambulating-independently.  Accompanied by wife.   Colin Decker 89 y.o.  male with no significant past medical history except for mild colitis; CKD stage III ; low grade MDS with thrombocytopenia/anemia is here for a follow up.   Complains of worsening fatigue. No in stool; or black colored stools. Patient admits to easy bruising although only on mild trauma.  No spontaneous bleeding.  Appetite 50% normal, no supplement drink.  Patient has had no recent hospitalizations.   Review of Systems  Constitutional:  Positive for malaise/fatigue. Negative for chills, diaphoresis, fever and weight loss.  HENT:  Negative for nosebleeds and sore throat.   Eyes:  Negative for double vision.  Respiratory:  Negative for cough, hemoptysis, sputum production, shortness of breath and wheezing.   Cardiovascular:  Negative for chest pain, palpitations, orthopnea and leg swelling.  Gastrointestinal:  Negative for abdominal pain, blood in stool, constipation, diarrhea, heartburn, melena, nausea and vomiting.  Genitourinary:  Negative for dysuria, frequency and urgency.  Musculoskeletal:  Negative for back pain and joint pain.  Skin: Negative.  Negative for itching and rash.  Neurological:  Negative for dizziness, tingling, focal weakness, weakness and headaches.  Endo/Heme/Allergies:  Bruises/bleeds easily.  Psychiatric/Behavioral:  Negative for depression. The patient is not nervous/anxious and does not have insomnia.      MEDICAL HISTORY:  Past Medical History:  Diagnosis Date   Asthma    Colitis due to Clostridium difficile 2012   CTS (carpal tunnel syndrome)    Diverticulitis    GERD (gastroesophageal reflux  disease)    HOH (hard of hearing)    Hyperlipidemia    Hypertension    Seasonal allergies     SURGICAL HISTORY: Past Surgical History:  Procedure Laterality Date   CARPAL TUNNEL RELEASE Right    CHOLECYSTECTOMY     COLONOSCOPY WITH PROPOFOL N/A 01/03/2020   Procedure: COLONOSCOPY WITH PROPOFOL;  Surgeon: Regis Bill, MD;  Location: [facility] ENDOS[facility];  Service: Endoscopy;  Laterality: N/A;   IR BONE MARROW BIOPSY & ASPIRATION  08/04/2023   NOSE SURGERY     FOR DEVIATED SEPTUM   SHOULDER SURGERY Left     SOCIAL HISTORY: Social History   Socioeconomic History   Marital status: Married    Spouse name: Colin Decker   Number of children: 2   Years of education: Not on file   Highest education level: Not on file  Occupational History   Not on file  Tobacco Use   Smoking status: Former   Smokeless tobacco: Never  Vaping Use   Vaping status: Never Used  Substance and Sexual Activity   Alcohol use: Yes    Comment: RARE   Drug use: Never   Sexual activity: Not on file  Other Topics Concern   Not on file  Social History Narrative   Lives with wife Colin Decker in a home. 2 sons    Social Drivers of Corporate investment banker Strain: Low Risk  (04/01/2023)   Received from [facility]   Overall Financial Resource Strain (CARDIA)    Difficulty of Paying Living Expenses: Not hard at all  Food Insecurity: No Food Insecurity (04/01/2023)   Received from [facility]   Hunger Vital Sign    Ran Out of Food  in the Last Year: Never true    Worried About Running Out of Food in the Last Year: Never true  Transportation Needs: No Transportation Needs (04/01/2023)   Received from [facility]   PRAPARE - Transportation    Lack of Transportation (Non-Medical): No    In the past 12 months, has lack of transportation kept you from medical appointments or from getting medications?: No  Physical Activity: Not on file  Stress: Not on file  Social  Connections: Not on file  Intimate Partner Violence: Not At Risk (02/26/2023)   Humiliation, Afraid, Rape, and Kick questionnaire    Fear of Current or Ex-Partner: No    Emotionally Abused: No    Physically Abused: No    Sexually Abused: No    FAMILY HISTORY: No family history on file.  ALLERGIES:  is allergic to ciprofloxacin, clarithromycin, codeine, codeine sulfate, naprosyn [naproxen], amoxicillin-pot clavulanate, and erythromycin.  MEDICATIONS:  Current Outpatient Medications  Medication Sig Dispense Refill   allopurinol (ZYLOPRIM) 100 MG tablet Take 200 mg by mouth daily.      amLODipine (NORVASC) 5 MG tablet Take 1 tablet (5 mg total) by mouth daily. 30 tablet 0   furosemide (LASIX) 20 MG tablet Take 20 mg by mouth daily.     Multiple Vitamin (MULTI-VITAMIN) tablet Take 1 tablet by mouth.      cephALEXin (KEFLEX) 500 MG capsule Take 1 capsule (500 mg total) by mouth 3 (three) times daily. (Patient not taking: Reported on 10/01/2023) 30 capsule 0   mesalamine (APRISO) 0.375 g 24 hr capsule Take 1,500 mg by mouth daily.     No current facility-administered medications for this visit.    PHYSICAL EXAMINATION:  Vitals:   10/01/23 0913 10/01/23 0928  BP: (!) 163/66 (!) 148/61  Pulse: 61   Resp: 20   Temp: 97.7 F (36.5 C)   SpO2: 100%      Filed Weights   10/01/23 0913  Weight: 120 lb (54.4 kg)     Physical Exam Vitals and nursing note reviewed.  HENT:     Head: Normocephalic and atraumatic.     Mouth/Throat:     Pharynx: Oropharynx is clear.  Eyes:     Extraocular Movements: Extraocular movements intact.     Pupils: Pupils are equal, round, and reactive to light.  Cardiovascular:     Rate and Rhythm: Normal rate and regular rhythm.  Pulmonary:     Comments: Decreased breath sounds bilaterally.  Abdominal:     Palpations: Abdomen is soft.  Musculoskeletal:        General: Normal range of motion.     Cervical back: Normal range of motion.  Skin:     General: Skin is warm.  Neurological:     General: No focal deficit present.     Mental Status: He is alert and oriented to person, place, and time.  Psychiatric:        Behavior: Behavior normal.        Judgment: Judgment normal.      LABORATORY DATA:  I have reviewed the data as listed Lab Results  Component Value Date   WBC 4.4 10/01/2023   HGB 9.8 (L) 10/01/2023   HCT 31.2 (L) 10/01/2023   MCV 107.2 (H) 10/01/2023   PLT 124 (L) 10/01/2023   Recent Labs    02/25/23 1952 02/25/23 2308 07/23/23 0901 08/15/23 0935 08/20/23 1405 10/01/23 0854  NA 134*   < > 137 139 136 137  K  6.0*   < > 4.8 5.5* 5.6* 4.3  CL 110   < > 112* 116* 113* 106  CO2 18*   < > 19* 18* 16* 22  GLUCOSE 225*   < > 175* 102* 116* 101*  BUN 43*   < > 47* 50* 42* 39*  CREATININE 1.52*   < > 1.63* 1.69* 1.82* 1.71*  CALCIUM 8.3*   < > 8.7* 8.7* 8.6* 8.7*  GFRNONAA 43*   < > 39* 37* 34* 37*  PROT 5.8*   < > 6.2* 6.2*  --  6.5  ALBUMIN 3.6   < > 3.9 3.7  --  4.0  AST 79*   < > 26 27  --  29  ALT 44   < > 14 18  --  16  ALKPHOS 19*   < > 75 77  --  80  BILITOT 0.5   < > 0.6 0.6  --  0.7  BILIDIR 0.3*  --   --   --   --   --   IBILI 0.2*  --   --   --   --   --    < > = values in this interval not displayed.    RADIOGRAPHIC STUDIES: I have personally reviewed the radiological images as listed and agreed with the findings in the report. No results found.   Symptomatic anemia # Anemia/thrombocytopenia [at least since 2021]-MULTIFACTORIAL- low grade MDS + CKD- hemoglobin 9-10 MCV 107; platelets 120-slowly trending down since 2021. FEB 2025- BONE MARROW, ASPIRATE, CLOT, CORE:  Overall normocellular bone marrow with otherwise orderly trilineage hematopoiesis.  Cytogenetics- NEG. Intelli Myeloid panel-TET2 c.4625dupA ([id number]) is located in exon 11   # Worsening baseline anemia/ LKG:MWNUUVOZ gentle iron [iron biglycinate; 28 mg ] 1 pill a day- sep Iron sat-33%; recommend Retacrit/Venofer. Proceed  with Retacrit.   # CKD stage IV/intermittent hyperkalemia- ? 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   # DISPOSITION:  # NO venofer;  ok with retacrit #  2 weeks-labs- H&H-possible retacrit # 4 weeks-labs- H&H-possible retacrit # 6 weeks-labs- H&H-possible retacrit # follow up in 8  weeks- MD; labs CBC CMP LDH;  iron studies; ferritin-  possible Retacrit or venofer-  Dr.B    All questions were answered. The patient knows to call the clinic with any problems, questions or concerns.    Earna Coder, MD 10/01/2023 9:58 AM

## 2023-10-01 NOTE — Progress Notes (Signed)
 Fatigue/weakness: YES Dyspena: YES Light headedness: NO Blood in stool: NO

## 2023-10-15 ENCOUNTER — Inpatient Hospital Stay

## 2023-10-15 DIAGNOSIS — N183 Chronic kidney disease, stage 3 unspecified: Secondary | ICD-10-CM | POA: Diagnosis not present

## 2023-10-15 DIAGNOSIS — N184 Chronic kidney disease, stage 4 (severe): Secondary | ICD-10-CM | POA: Diagnosis not present

## 2023-10-15 DIAGNOSIS — D649 Anemia, unspecified: Secondary | ICD-10-CM

## 2023-10-15 LAB — HEMOGLOBIN AND HEMATOCRIT (CANCER CENTER ONLY)
HCT: 32.4 % — ABNORMAL LOW (ref 39.0–52.0)
Hemoglobin: 10.4 g/dL — ABNORMAL LOW (ref 13.0–17.0)

## 2023-10-15 NOTE — Progress Notes (Signed)
 Is 10.4; hold retacrit 

## 2023-10-22 ENCOUNTER — Telehealth: Payer: Self-pay | Admitting: *Deleted

## 2023-10-22 NOTE — Telephone Encounter (Signed)
 The wife called back because she said there was a message that sent through yesterday but no one can see it and the patient accidentally picked it up the telephone but then he could not understand what was going on so he hung up.  So after I talk to the wife this morning it seems like it was automatic call for his next appointments .  Wife is okay with that

## 2023-10-29 ENCOUNTER — Inpatient Hospital Stay: Attending: Internal Medicine

## 2023-10-29 ENCOUNTER — Inpatient Hospital Stay

## 2023-10-29 DIAGNOSIS — N1832 Chronic kidney disease, stage 3b: Secondary | ICD-10-CM | POA: Insufficient documentation

## 2023-10-29 DIAGNOSIS — E860 Dehydration: Secondary | ICD-10-CM | POA: Diagnosis not present

## 2023-10-29 DIAGNOSIS — N492 Inflammatory disorders of scrotum: Secondary | ICD-10-CM | POA: Insufficient documentation

## 2023-10-29 DIAGNOSIS — D696 Thrombocytopenia, unspecified: Secondary | ICD-10-CM | POA: Insufficient documentation

## 2023-10-29 DIAGNOSIS — D649 Anemia, unspecified: Secondary | ICD-10-CM

## 2023-10-29 DIAGNOSIS — N183 Chronic kidney disease, stage 3 unspecified: Secondary | ICD-10-CM | POA: Insufficient documentation

## 2023-10-29 DIAGNOSIS — D631 Anemia in chronic kidney disease: Secondary | ICD-10-CM | POA: Insufficient documentation

## 2023-10-29 DIAGNOSIS — Z87891 Personal history of nicotine dependence: Secondary | ICD-10-CM | POA: Diagnosis not present

## 2023-10-29 DIAGNOSIS — K529 Noninfective gastroenteritis and colitis, unspecified: Secondary | ICD-10-CM | POA: Diagnosis not present

## 2023-10-29 DIAGNOSIS — R5383 Other fatigue: Secondary | ICD-10-CM | POA: Insufficient documentation

## 2023-10-29 LAB — HEMOGLOBIN AND HEMATOCRIT (CANCER CENTER ONLY)
HCT: 30.9 % — ABNORMAL LOW (ref 39.0–52.0)
Hemoglobin: 10.2 g/dL — ABNORMAL LOW (ref 13.0–17.0)

## 2023-10-29 NOTE — Progress Notes (Signed)
 Hgb 10.2 today, no Retacrit  needed

## 2023-11-12 ENCOUNTER — Inpatient Hospital Stay

## 2023-11-12 VITALS — BP 173/66

## 2023-11-12 DIAGNOSIS — D649 Anemia, unspecified: Secondary | ICD-10-CM

## 2023-11-12 DIAGNOSIS — N1832 Chronic kidney disease, stage 3b: Secondary | ICD-10-CM | POA: Diagnosis not present

## 2023-11-12 DIAGNOSIS — N183 Chronic kidney disease, stage 3 unspecified: Secondary | ICD-10-CM | POA: Diagnosis not present

## 2023-11-12 LAB — HEMOGLOBIN AND HEMATOCRIT (CANCER CENTER ONLY)
HCT: 29.6 % — ABNORMAL LOW (ref 39.0–52.0)
Hemoglobin: 9.7 g/dL — ABNORMAL LOW (ref 13.0–17.0)

## 2023-11-12 MED ORDER — EPOETIN ALFA 20000 UNIT/ML IJ SOLN
20000.0000 [IU] | Freq: Once | INTRAMUSCULAR | Status: AC
  Administered 2023-11-12: 20000 [IU] via SUBCUTANEOUS
  Filled 2023-11-12: qty 1

## 2023-11-26 ENCOUNTER — Inpatient Hospital Stay: Attending: Internal Medicine

## 2023-11-26 ENCOUNTER — Encounter: Payer: Self-pay | Admitting: Internal Medicine

## 2023-11-26 ENCOUNTER — Inpatient Hospital Stay (HOSPITAL_BASED_OUTPATIENT_CLINIC_OR_DEPARTMENT_OTHER): Admitting: Internal Medicine

## 2023-11-26 ENCOUNTER — Inpatient Hospital Stay

## 2023-11-26 VITALS — BP 160/63 | HR 60 | Temp 97.6°F | Resp 20 | Ht 60.0 in | Wt 123.3 lb

## 2023-11-26 DIAGNOSIS — R5383 Other fatigue: Secondary | ICD-10-CM | POA: Diagnosis not present

## 2023-11-26 DIAGNOSIS — D696 Thrombocytopenia, unspecified: Secondary | ICD-10-CM | POA: Insufficient documentation

## 2023-11-26 DIAGNOSIS — K529 Noninfective gastroenteritis and colitis, unspecified: Secondary | ICD-10-CM | POA: Diagnosis not present

## 2023-11-26 DIAGNOSIS — D631 Anemia in chronic kidney disease: Secondary | ICD-10-CM | POA: Diagnosis present

## 2023-11-26 DIAGNOSIS — D649 Anemia, unspecified: Secondary | ICD-10-CM

## 2023-11-26 DIAGNOSIS — I129 Hypertensive chronic kidney disease with stage 1 through stage 4 chronic kidney disease, or unspecified chronic kidney disease: Secondary | ICD-10-CM | POA: Diagnosis not present

## 2023-11-26 DIAGNOSIS — E860 Dehydration: Secondary | ICD-10-CM | POA: Insufficient documentation

## 2023-11-26 DIAGNOSIS — Z87891 Personal history of nicotine dependence: Secondary | ICD-10-CM | POA: Insufficient documentation

## 2023-11-26 DIAGNOSIS — N184 Chronic kidney disease, stage 4 (severe): Secondary | ICD-10-CM | POA: Diagnosis present

## 2023-11-26 LAB — CBC WITH DIFFERENTIAL (CANCER CENTER ONLY)
Abs Immature Granulocytes: 0.01 10*3/uL (ref 0.00–0.07)
Basophils Absolute: 0.1 10*3/uL (ref 0.0–0.1)
Basophils Relative: 2 %
Eosinophils Absolute: 0.1 10*3/uL (ref 0.0–0.5)
Eosinophils Relative: 2 %
HCT: 31.2 % — ABNORMAL LOW (ref 39.0–52.0)
Hemoglobin: 9.9 g/dL — ABNORMAL LOW (ref 13.0–17.0)
Immature Granulocytes: 0 %
Lymphocytes Relative: 22 %
Lymphs Abs: 0.9 10*3/uL (ref 0.7–4.0)
MCH: 33.7 pg (ref 26.0–34.0)
MCHC: 31.7 g/dL (ref 30.0–36.0)
MCV: 106.1 fL — ABNORMAL HIGH (ref 80.0–100.0)
Monocytes Absolute: 0.4 10*3/uL (ref 0.1–1.0)
Monocytes Relative: 11 %
Neutro Abs: 2.6 10*3/uL (ref 1.7–7.7)
Neutrophils Relative %: 63 %
Platelet Count: 137 10*3/uL — ABNORMAL LOW (ref 150–400)
RBC: 2.94 MIL/uL — ABNORMAL LOW (ref 4.22–5.81)
RDW: 14.4 % (ref 11.5–15.5)
WBC Count: 4.1 10*3/uL (ref 4.0–10.5)
nRBC: 0 % (ref 0.0–0.2)

## 2023-11-26 LAB — IRON AND TIBC
Iron: 72 ug/dL (ref 45–182)
Saturation Ratios: 30 % (ref 17.9–39.5)
TIBC: 242 ug/dL — ABNORMAL LOW (ref 250–450)
UIBC: 170 ug/dL

## 2023-11-26 LAB — CMP (CANCER CENTER ONLY)
ALT: 14 U/L (ref 0–44)
AST: 26 U/L (ref 15–41)
Albumin: 3.8 g/dL (ref 3.5–5.0)
Alkaline Phosphatase: 79 U/L (ref 38–126)
Anion gap: 7 (ref 5–15)
BUN: 38 mg/dL — ABNORMAL HIGH (ref 8–23)
CO2: 24 mmol/L (ref 22–32)
Calcium: 8.6 mg/dL — ABNORMAL LOW (ref 8.9–10.3)
Chloride: 107 mmol/L (ref 98–111)
Creatinine: 1.68 mg/dL — ABNORMAL HIGH (ref 0.61–1.24)
GFR, Estimated: 38 mL/min — ABNORMAL LOW (ref >60)
Glucose, Bld: 94 mg/dL (ref 70–99)
Potassium: 4.6 mmol/L (ref 3.5–5.1)
Sodium: 138 mmol/L (ref 135–145)
Total Bilirubin: 1 mg/dL (ref 0.0–1.2)
Total Protein: 6 g/dL — ABNORMAL LOW (ref 6.5–8.1)

## 2023-11-26 LAB — FERRITIN: Ferritin: 101 ng/mL (ref 24–336)

## 2023-11-26 LAB — LACTATE DEHYDROGENASE: LDH: 191 U/L (ref 98–192)

## 2023-11-26 MED ORDER — EPOETIN ALFA 20000 UNIT/ML IJ SOLN
20000.0000 [IU] | Freq: Once | INTRAMUSCULAR | Status: AC
  Administered 2023-11-26: 20000 [IU] via SUBCUTANEOUS
  Filled 2023-11-26: qty 1

## 2023-11-26 NOTE — Assessment & Plan Note (Addendum)
#   REVISED IPSS-2- LOW RISK- Anemia/thrombocytopenia [at least since 2021]-MULTIFACTORIAL- low grade MDS + CKD- hemoglobin 9-10 MCV 107; platelets 120-slowly trending down since 2021. FEB 2025- BONE MARROW, ASPIRATE, CLOT, CORE:  Overall normocellular bone marrow with otherwise orderly trilineage hematopoiesis.  Cytogenetics- NEG. Intelli Myeloid panel-TET2 c.4625dupA ([id number]) is located in exon 11.   # Worsening baseline anemia/ WJX:BJYNWGNF gentle iron [iron biglycinate; 28 mg ] 1 pill a day-APRIL 2025 Iron sat-27%; recommend Retacrit /Venofer. Proceed with Retacrit .   # CKD stage IV/intermittent hyperkalemia- ? Dehydration/ARB -[  Dr.Singh]- stable.  # Hypertension-poorly controlled-170s systolic. recommend compliance with medication/also checking blood pressures at home frequently.keep a log of blood pressures. Will repeat BP.   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 stable.   # DISPOSITION:  # repeat BP # NO venofer;  ok with retacrit  #  2 weeks-labs- H&H-possible retacrit  # 4 weeks-labs- H&H-possible retacrit  # 6 weeks-labs- H&H-possible retacrit  # follow up in 8  weeks- MD; labs CBC CMP LDH;  iron studies; ferritin-  possible Retacrit  or venofer-  Dr.B

## 2023-11-26 NOTE — Progress Notes (Signed)
 [facility] CONSULT NOTE  Patient Care Team: Monique Ano, MD as PCP - General (Family Medicine) Gwyn Leos, MD as Consulting Physician (Oncology)  CHIEF COMPLAINTS/PURPOSE OF CONSULTATION: Thrombocytopenia/anemia  HISTORY OF PRESENTING ILLNESS: Patient ambulating-independently.  Accompanied by wife.   Colin Decker 89 y.o.  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   Complains of worsening fatigue. No in stool; or black colored stools. Patient admits to easy bruising although only on mild trauma.  No spontaneous bleeding.  Appetite 50% normal, no supplement drink.  Patient has had no recent hospitalizations.   Review of Systems  Constitutional:  Positive for malaise/fatigue. Negative for chills, diaphoresis, fever and weight loss.  HENT:  Negative for nosebleeds and sore throat.   Eyes:  Negative for double vision.  Respiratory:  Negative for cough, hemoptysis, sputum production, shortness of breath and wheezing.   Cardiovascular:  Negative for chest pain, palpitations, orthopnea and leg swelling.  Gastrointestinal:  Negative for abdominal pain, blood in stool, constipation, diarrhea, heartburn, melena, nausea and vomiting.  Genitourinary:  Negative for dysuria, frequency and urgency.  Musculoskeletal:  Negative for back pain and joint pain.  Skin: Negative.  Negative for itching and rash.  Neurological:  Negative for dizziness, tingling, focal weakness, weakness and headaches.  Endo/Heme/Allergies:  Bruises/bleeds easily.  Psychiatric/Behavioral:  Negative for depression. The patient is not nervous/anxious and does not have insomnia.      MEDICAL HISTORY:  Past Medical History:  Diagnosis Date   Asthma    Colitis due to Clostridium difficile 2012   CTS (carpal tunnel syndrome)    Diverticulitis    GERD (gastroesophageal reflux  disease)    HOH (hard of hearing)    Hyperlipidemia    Hypertension    Seasonal allergies     SURGICAL HISTORY: Past Surgical History:  Procedure Laterality Date   CARPAL TUNNEL RELEASE Right    CHOLECYSTECTOMY     COLONOSCOPY WITH PROPOFOL  N/A 01/03/2020   Procedure: COLONOSCOPY WITH PROPOFOL ;  Surgeon: Shane Darling, MD;  Location: [facility] ENDOSCOPY;  Service: Endoscopy;  Laterality: N/A;   IR BONE MARROW BIOPSY & ASPIRATION  08/04/2023   NOSE SURGERY     FOR DEVIATED SEPTUM   SHOULDER SURGERY Left     SOCIAL HISTORY: Social History   Socioeconomic History   Marital status: Married    Spouse name: [name]   Number of children: 2   Years of education: Not on file   Highest education level: Not on file  Occupational History   Not on file  Tobacco Use   Smoking status: Former   Smokeless tobacco: Never  Vaping Use   Vaping status: Never Used  Substance and Sexual Activity   Alcohol use: Yes    Comment: RARE   Drug use: Never   Sexual activity: Not on file  Other Topics Concern   Not on file  Social History Narrative   Lives with wife [name] in a home. 2 sons    Social Drivers of Corporate investment banker Strain: Low Risk  (04/01/2023)   Received from [facility]   Overall Financial Resource Strain (CARDIA)    Difficulty of Paying Living Expenses: Not hard at all  Food Insecurity: No Food Insecurity (04/01/2023)   Received from [facility]   Hunger Vital Sign    Ran Out of Food in  the Last Year: Never true    Worried About Running Out of Food in the Last Year: Never true  Transportation Needs: No Transportation Needs (04/01/2023)   Received from [facility]   PRAPARE - Transportation    Lack of Transportation (Non-Medical): No    In the past 12 months, has lack of transportation kept you from medical appointments or from getting medications?: No  Physical Activity: Not on file  Stress: Not on file  Social  Connections: Not on file  Intimate Partner Violence: Not At Risk (02/26/2023)   Humiliation, Afraid, Rape, and Kick questionnaire    Fear of Current or Ex-Partner: No    Emotionally Abused: No    Physically Abused: No    Sexually Abused: No    FAMILY HISTORY: History reviewed. No pertinent family history.  ALLERGIES:  is allergic to ciprofloxacin, clarithromycin, codeine, codeine sulfate, naprosyn [naproxen], amoxicillin-pot clavulanate, and erythromycin .  MEDICATIONS:  Current Outpatient Medications  Medication Sig Dispense Refill   allopurinol  (ZYLOPRIM ) 100 MG tablet Take 200 mg by mouth daily.      Ferrous Gluconate (IRON 27 PO) Take 27 mg by mouth daily.     furosemide (LASIX) 20 MG tablet Take 20 mg by mouth. Takes 1 daily three times a week     lisinopril (ZESTRIL) 2.5 MG tablet Take 2.5 mg by mouth every other day.     mesalamine  (APRISO ) 0.375 g 24 hr capsule Take 1,500 mg by mouth daily.     Multiple Vitamin (MULTI-VITAMIN) tablet Take 1 tablet by mouth.      amLODipine  (NORVASC ) 5 MG tablet Take 1 tablet (5 mg total) by mouth daily. (Patient not taking: Reported on 11/26/2023) 30 tablet 0   No current facility-administered medications for this visit.    PHYSICAL EXAMINATION:  Vitals:   11/26/23 0857 11/26/23 0920  BP: (!) 160/74 (!) 160/63  Pulse: 60   Resp: 20   Temp: 97.6 F (36.4 C)   SpO2: 100%      Filed Weights   11/26/23 0857  Weight: 123 lb 4.8 oz (55.9 kg)     Physical Exam Vitals and nursing note reviewed.  HENT:     Head: Normocephalic and atraumatic.     Mouth/Throat:     Pharynx: Oropharynx is clear.  Eyes:     Extraocular Movements: Extraocular movements intact.     Pupils: Pupils are equal, round, and reactive to light.  Cardiovascular:     Rate and Rhythm: Normal rate and regular rhythm.  Pulmonary:     Comments: Decreased breath sounds bilaterally.  Abdominal:     Palpations: Abdomen is soft.  Musculoskeletal:        General:  Normal range of motion.     Cervical back: Normal range of motion.  Skin:    General: Skin is warm.  Neurological:     General: No focal deficit present.     Mental Status: He is alert and oriented to person, place, and time.  Psychiatric:        Behavior: Behavior normal.        Judgment: Judgment normal.      LABORATORY DATA:  I have reviewed the data as listed Lab Results  Component Value Date   WBC 4.1 11/26/2023   HGB 9.9 (L) 11/26/2023   HCT 31.2 (L) 11/26/2023   MCV 106.1 (H) 11/26/2023   PLT 137 (L) 11/26/2023   Recent Labs    02/25/23 1952 02/25/23 2308 08/15/23 0935 08/20/23  1405 10/01/23 0854 11/26/23 0853  NA 134*   < > 139 136 137 138  K 6.0*   < > 5.5* 5.6* 4.3 4.6  CL 110   < > 116* 113* 106 107  CO2 18*   < > 18* 16* 22 24  GLUCOSE 225*   < > 102* 116* 101* 94  BUN 43*   < > 50* 42* 39* 38*  CREATININE 1.52*   < > 1.69* 1.82* 1.71* 1.68*  CALCIUM  8.3*   < > 8.7* 8.6* 8.7* 8.6*  GFRNONAA 43*   < > 37* 34* 37* 38*  PROT 5.8*   < > 6.2*  --  6.5 6.0*  ALBUMIN 3.6   < > 3.7  --  4.0 3.8  AST 79*   < > 27  --  29 26  ALT 44   < > 18  --  16 14  ALKPHOS 19*   < > 77  --  80 79  BILITOT 0.5   < > 0.6  --  0.7 1.0  BILIDIR 0.3*  --   --   --   --   --   IBILI 0.2*  --   --   --   --   --    < > = values in this interval not displayed.    RADIOGRAPHIC STUDIES: I have personally reviewed the radiological images as listed and agreed with the findings in the report. No results found.   Symptomatic anemia # REVISED IPSS-2- LOW RISK- Anemia/thrombocytopenia [at least since 2021]-MULTIFACTORIAL- low grade MDS + CKD- hemoglobin 9-10 MCV 107; platelets 120-slowly trending down since 2021. FEB 2025- BONE MARROW, ASPIRATE, CLOT, CORE:  Overall normocellular bone marrow with otherwise orderly trilineage hematopoiesis.  Cytogenetics- NEG. Intelli Myeloid panel-TET2 c.4625dupA ([id number]) is located in exon 11.   # Worsening baseline anemia/ VHQ:IONGEXBM  gentle iron [iron biglycinate; 28 mg ] 1 pill a day-APRIL 2025 Iron sat-27%; recommend Retacrit /Venofer. Proceed with Retacrit .   # CKD stage IV/intermittent hyperkalemia- ? Dehydration/ARB -[  Dr.Singh]- stable.  # Hypertension-poorly controlled-170s systolic. recommend compliance with medication/also checking blood pressures at home frequently.keep a log of blood pressures. Will repeat BP.   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 stable.   # DISPOSITION:  # repeat BP # NO venofer;  ok with retacrit  #  2 weeks-labs- H&H-possible retacrit  # 4 weeks-labs- H&H-possible retacrit  # 6 weeks-labs- H&H-possible retacrit  # follow up in 8  weeks- MD; labs CBC CMP LDH;  iron studies; ferritin-  possible Retacrit  or venofer-  Dr.B    All questions were answered. The patient knows to call the clinic with any problems, questions or concerns.    Gwyn Leos, MD 11/26/2023 12:04 PM

## 2023-11-26 NOTE — Progress Notes (Signed)
 Per MD No venofer. Retacrit  only today

## 2023-11-26 NOTE — Progress Notes (Signed)
 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 Biopsy done 08/04/23.

## 2023-12-09 ENCOUNTER — Other Ambulatory Visit: Payer: Self-pay | Admitting: *Deleted

## 2023-12-09 DIAGNOSIS — D649 Anemia, unspecified: Secondary | ICD-10-CM

## 2023-12-10 ENCOUNTER — Inpatient Hospital Stay

## 2023-12-24 ENCOUNTER — Inpatient Hospital Stay: Attending: Internal Medicine

## 2023-12-24 ENCOUNTER — Inpatient Hospital Stay

## 2023-12-24 VITALS — BP 165/62

## 2023-12-24 DIAGNOSIS — N184 Chronic kidney disease, stage 4 (severe): Secondary | ICD-10-CM | POA: Diagnosis present

## 2023-12-24 DIAGNOSIS — D631 Anemia in chronic kidney disease: Secondary | ICD-10-CM | POA: Diagnosis present

## 2023-12-24 DIAGNOSIS — D649 Anemia, unspecified: Secondary | ICD-10-CM

## 2023-12-24 LAB — HEMOGLOBIN AND HEMATOCRIT (CANCER CENTER ONLY)
HCT: 31.1 % — ABNORMAL LOW (ref 39.0–52.0)
Hemoglobin: 9.9 g/dL — ABNORMAL LOW (ref 13.0–17.0)

## 2023-12-24 MED ORDER — EPOETIN ALFA 20000 UNIT/ML IJ SOLN
20000.0000 [IU] | Freq: Once | INTRAMUSCULAR | Status: AC
  Administered 2023-12-24: 20000 [IU] via SUBCUTANEOUS
  Filled 2023-12-24: qty 1

## 2024-01-07 ENCOUNTER — Inpatient Hospital Stay

## 2024-01-07 VITALS — BP 148/62

## 2024-01-07 DIAGNOSIS — D649 Anemia, unspecified: Secondary | ICD-10-CM

## 2024-01-07 DIAGNOSIS — N184 Chronic kidney disease, stage 4 (severe): Secondary | ICD-10-CM | POA: Diagnosis not present

## 2024-01-07 LAB — CBC WITH DIFFERENTIAL (CANCER CENTER ONLY)
Abs Immature Granulocytes: 0.02 K/uL (ref 0.00–0.07)
Basophils Absolute: 0.1 K/uL (ref 0.0–0.1)
Basophils Relative: 1 %
Eosinophils Absolute: 0.1 K/uL (ref 0.0–0.5)
Eosinophils Relative: 1 %
HCT: 30.2 % — ABNORMAL LOW (ref 39.0–52.0)
Hemoglobin: 9.7 g/dL — ABNORMAL LOW (ref 13.0–17.0)
Immature Granulocytes: 0 %
Lymphocytes Relative: 21 %
Lymphs Abs: 1.1 K/uL (ref 0.7–4.0)
MCH: 33.8 pg (ref 26.0–34.0)
MCHC: 32.1 g/dL (ref 30.0–36.0)
MCV: 105.2 fL — ABNORMAL HIGH (ref 80.0–100.0)
Monocytes Absolute: 0.5 K/uL (ref 0.1–1.0)
Monocytes Relative: 10 %
Neutro Abs: 3.3 K/uL (ref 1.7–7.7)
Neutrophils Relative %: 67 %
Platelet Count: 158 K/uL (ref 150–400)
RBC: 2.87 MIL/uL — ABNORMAL LOW (ref 4.22–5.81)
RDW: 14.3 % (ref 11.5–15.5)
WBC Count: 5 K/uL (ref 4.0–10.5)
nRBC: 0 % (ref 0.0–0.2)

## 2024-01-07 MED ORDER — EPOETIN ALFA 20000 UNIT/ML IJ SOLN
20000.0000 [IU] | Freq: Once | INTRAMUSCULAR | Status: AC
  Administered 2024-01-07: 20000 [IU] via SUBCUTANEOUS
  Filled 2024-01-07: qty 1

## 2024-01-21 ENCOUNTER — Inpatient Hospital Stay (HOSPITAL_BASED_OUTPATIENT_CLINIC_OR_DEPARTMENT_OTHER): Admitting: Internal Medicine

## 2024-01-21 ENCOUNTER — Inpatient Hospital Stay

## 2024-01-21 ENCOUNTER — Encounter: Payer: Self-pay | Admitting: Internal Medicine

## 2024-01-21 VITALS — BP 149/51 | HR 62

## 2024-01-21 DIAGNOSIS — D649 Anemia, unspecified: Secondary | ICD-10-CM | POA: Diagnosis not present

## 2024-01-21 DIAGNOSIS — N184 Chronic kidney disease, stage 4 (severe): Secondary | ICD-10-CM | POA: Diagnosis not present

## 2024-01-21 LAB — CMP (CANCER CENTER ONLY)
ALT: 13 U/L (ref 0–44)
AST: 25 U/L (ref 15–41)
Albumin: 3.7 g/dL (ref 3.5–5.0)
Alkaline Phosphatase: 77 U/L (ref 38–126)
Anion gap: 5 (ref 5–15)
BUN: 37 mg/dL — ABNORMAL HIGH (ref 8–23)
CO2: 22 mmol/L (ref 22–32)
Calcium: 8.4 mg/dL — ABNORMAL LOW (ref 8.9–10.3)
Chloride: 112 mmol/L — ABNORMAL HIGH (ref 98–111)
Creatinine: 1.7 mg/dL — ABNORMAL HIGH (ref 0.61–1.24)
GFR, Estimated: 37 mL/min — ABNORMAL LOW (ref >60)
Glucose, Bld: 97 mg/dL (ref 70–99)
Potassium: 4.4 mmol/L (ref 3.5–5.1)
Sodium: 139 mmol/L (ref 135–145)
Total Bilirubin: 0.9 mg/dL (ref 0.0–1.2)
Total Protein: 6.1 g/dL — ABNORMAL LOW (ref 6.5–8.1)

## 2024-01-21 LAB — CBC WITH DIFFERENTIAL (CANCER CENTER ONLY)
Abs Immature Granulocytes: 0.01 K/uL (ref 0.00–0.07)
Basophils Absolute: 0.1 K/uL (ref 0.0–0.1)
Basophils Relative: 1 %
Eosinophils Absolute: 0 K/uL (ref 0.0–0.5)
Eosinophils Relative: 0 %
HCT: 30.8 % — ABNORMAL LOW (ref 39.0–52.0)
Hemoglobin: 9.8 g/dL — ABNORMAL LOW (ref 13.0–17.0)
Immature Granulocytes: 0 %
Lymphocytes Relative: 20 %
Lymphs Abs: 0.9 K/uL (ref 0.7–4.0)
MCH: 33.9 pg (ref 26.0–34.0)
MCHC: 31.8 g/dL (ref 30.0–36.0)
MCV: 106.6 fL — ABNORMAL HIGH (ref 80.0–100.0)
Monocytes Absolute: 0.5 K/uL (ref 0.1–1.0)
Monocytes Relative: 11 %
Neutro Abs: 3 K/uL (ref 1.7–7.7)
Neutrophils Relative %: 68 %
Platelet Count: 111 K/uL — ABNORMAL LOW (ref 150–400)
RBC: 2.89 MIL/uL — ABNORMAL LOW (ref 4.22–5.81)
RDW: 14.6 % (ref 11.5–15.5)
WBC Count: 4.5 K/uL (ref 4.0–10.5)
nRBC: 0 % (ref 0.0–0.2)

## 2024-01-21 LAB — IRON AND TIBC
Iron: 69 ug/dL (ref 45–182)
Saturation Ratios: 31 % (ref 17.9–39.5)
TIBC: 220 ug/dL — ABNORMAL LOW (ref 250–450)
UIBC: 151 ug/dL

## 2024-01-21 LAB — LACTATE DEHYDROGENASE: LDH: 187 U/L (ref 98–192)

## 2024-01-21 LAB — FERRITIN: Ferritin: 111 ng/mL (ref 24–336)

## 2024-01-21 MED ORDER — EPOETIN ALFA 20000 UNIT/ML IJ SOLN
20000.0000 [IU] | Freq: Once | INTRAMUSCULAR | Status: AC
  Administered 2024-01-21: 20000 [IU] via SUBCUTANEOUS
  Filled 2024-01-21: qty 1

## 2024-01-21 NOTE — Assessment & Plan Note (Addendum)
#   REVISED IPSS-2- LOW RISK- Anemia/thrombocytopenia [at least since 2021]-MULTIFACTORIAL- low grade MDS + CKD- hemoglobin 9-10 MCV 107; platelets 120-slowly trending down since 2021. FEB 2025- BONE MARROW, ASPIRATE, CLOT, CORE:  Overall normocellular bone marrow with otherwise orderly trilineage hematopoiesis.  Cytogenetics- NEG. Intelli Myeloid panel-TET2 c.4625dupA ([id number]) is located in exon 11.   # Worsening baseline anemia/ RXI:rnwupwlz gentle iron [iron biglycinate; 28 mg ] 1 pill a day-APRIL 2025 Iron sat-27%; recommend Retacrit /Venofer. Proceed with Retacrit .   # CKD stage IV/intermittent hyperkalemia- ? Dehydration/ARB -[  Dr.Singh]- stable.  # Hypertension-poorly controlled-160s systolic. recommend compliance with medication/also checking blood pressures at home frequently.keep a log of blood pressures.stable.  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 stable.    iron studies; ferritin-  # DISPOSITION:  # repeat BP # NO venofer;  ok with retacrit  #  2 weeks-labs- H&H-possible retacrit  # 4 weeks-labs- H&H-possible retacrit  # 6 weeks-labs- H&H-possible retacrit  # follow up in 8  weeks- MD; labs CBC CMP LDH;   possible Retacrit  or venofer-  Dr.B

## 2024-01-21 NOTE — Progress Notes (Signed)
 [facility] CONSULT NOTE  Patient Care Team: Alla Amis, MD as PCP - General (Family Medicine) Rennie Cindy SAUNDERS, MD as Consulting Physician (Oncology)  CHIEF COMPLAINTS/PURPOSE OF CONSULTATION: Thrombocytopenia/anemia- MDS-anemia- sec to CKD  HISTORY OF PRESENTING ILLNESS: Patient ambulating-independently.  Accompanied by wife.   Colin Decker 89 y.o.  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   Patient denies any worsening fatigue. No in stool; or black colored stools.  No spontaneous bleeding.  Appetite 50% normal, no supplement drink.  Patient has had no recent hospitalizations.   Review of Systems  Constitutional:  Positive for malaise/fatigue. Negative for chills, diaphoresis, fever and weight loss.  HENT:  Negative for nosebleeds and sore throat.   Eyes:  Negative for double vision.  Respiratory:  Negative for cough, hemoptysis, sputum production, shortness of breath and wheezing.   Cardiovascular:  Negative for chest pain, palpitations, orthopnea and leg swelling.  Gastrointestinal:  Negative for abdominal pain, blood in stool, constipation, diarrhea, heartburn, melena, nausea and vomiting.  Genitourinary:  Negative for dysuria, frequency and urgency.  Musculoskeletal:  Negative for back pain and joint pain.  Skin: Negative.  Negative for itching and rash.  Neurological:  Negative for dizziness, tingling, focal weakness, weakness and headaches.  Endo/Heme/Allergies:  Bruises/bleeds easily.  Psychiatric/Behavioral:  Negative for depression. The patient is not nervous/anxious and does not have insomnia.      MEDICAL HISTORY:  Past Medical History:  Diagnosis Date   Asthma    Colitis due to Clostridium difficile 2012   CTS (carpal tunnel syndrome)    Diverticulitis    GERD (gastroesophageal reflux disease)    HOH (hard of hearing)     Hyperlipidemia    Hypertension    Seasonal allergies     SURGICAL HISTORY: Past Surgical History:  Procedure Laterality Date   CARPAL TUNNEL RELEASE Right    CHOLECYSTECTOMY     COLONOSCOPY WITH PROPOFOL  N/A 01/03/2020   Procedure: COLONOSCOPY WITH PROPOFOL ;  Surgeon: Maryruth Ole DASEN, MD;  Location: [facility] ENDOSCOPY;  Service: Endoscopy;  Laterality: N/A;   IR BONE MARROW BIOPSY & ASPIRATION  08/04/2023   NOSE SURGERY     FOR DEVIATED SEPTUM   SHOULDER SURGERY Left     SOCIAL HISTORY: Social History   Socioeconomic History   Marital status: Married    Spouse name: Levorn   Number of children: 2   Years of education: Not on file   Highest education level: Not on file  Occupational History   Not on file  Tobacco Use   Smoking status: Former   Smokeless tobacco: Never  Vaping Use   Vaping status: Never Used  Substance and Sexual Activity   Alcohol use: Yes    Comment: RARE   Drug use: Never   Sexual activity: Not on file  Other Topics Concern   Not on file  Social History Narrative   Lives with wife Levorn in a home. 2 sons    Social Drivers of Corporate investment banker Strain: Low Risk  (04/01/2023)   Received from [facility]   Overall Financial Resource Strain (CARDIA)    Difficulty of Paying Living Expenses: Not hard at all  Food Insecurity: No Food Insecurity (04/01/2023)   Received from [facility]   Hunger Vital Sign    Within the past 12 months, the food you bought just  didn't last and you didn't have money to get more.: Never true    Within the past 12 months, you worried that your food would run out before you got the money to buy more.: Never true  Transportation Needs: No Transportation Needs (04/01/2023)   Received from [facility]   PRAPARE - Transportation    Lack of Transportation (Non-Medical): No    In the past 12 months, has lack of transportation kept you from medical appointments or from  getting medications?: No  Physical Activity: Not on file  Stress: Not on file  Social Connections: Not on file  Intimate Partner Violence: Not At Risk (02/26/2023)   Humiliation, Afraid, Rape, and Kick questionnaire    Fear of Current or Ex-Partner: No    Emotionally Abused: No    Physically Abused: No    Sexually Abused: No    FAMILY HISTORY: History reviewed. No pertinent family history.  ALLERGIES:  is allergic to ciprofloxacin, clarithromycin, codeine, codeine sulfate, naprosyn [naproxen], amoxicillin-pot clavulanate, and erythromycin .  MEDICATIONS:  Current Outpatient Medications  Medication Sig Dispense Refill   allopurinol  (ZYLOPRIM ) 100 MG tablet Take 200 mg by mouth daily.      amLODipine  (NORVASC ) 5 MG tablet Take 1 tablet (5 mg total) by mouth daily. 30 tablet 0   Ferrous Gluconate (IRON 27 PO) Take 27 mg by mouth daily.     furosemide (LASIX) 20 MG tablet Take 20 mg by mouth. Takes 1 daily three times a week     lisinopril (ZESTRIL) 2.5 MG tablet Take 2.5 mg by mouth every other day.     mesalamine  (APRISO ) 0.375 g 24 hr capsule Take 1,500 mg by mouth daily.     Multiple Vitamin (MULTI-VITAMIN) tablet Take 1 tablet by mouth.      No current facility-administered medications for this visit.    PHYSICAL EXAMINATION:  Vitals:   01/21/24 0930 01/21/24 0949  BP: (!) 158/60 (!) 149/51  Pulse:       There were no vitals filed for this visit.    Physical Exam Vitals and nursing note reviewed.  HENT:     Head: Normocephalic and atraumatic.     Mouth/Throat:     Pharynx: Oropharynx is clear.  Eyes:     Extraocular Movements: Extraocular movements intact.     Pupils: Pupils are equal, round, and reactive to light.  Cardiovascular:     Rate and Rhythm: Normal rate and regular rhythm.  Pulmonary:     Comments: Decreased breath sounds bilaterally.  Abdominal:     Palpations: Abdomen is soft.  Musculoskeletal:        General: Normal range of motion.      Cervical back: Normal range of motion.  Skin:    General: Skin is warm.  Neurological:     General: No focal deficit present.     Mental Status: He is alert and oriented to person, place, and time.  Psychiatric:        Behavior: Behavior normal.        Judgment: Judgment normal.      LABORATORY DATA:  I have reviewed the data as listed Lab Results  Component Value Date   WBC 4.5 01/21/2024   HGB 9.8 (L) 01/21/2024   HCT 30.8 (L) 01/21/2024   MCV 106.6 (H) 01/21/2024   PLT 111 (L) 01/21/2024   Recent Labs    02/25/23 1952 02/25/23 2308 10/01/23 0854 11/26/23 0853 01/21/24 0906  NA 134*   < >  137 138 139  K 6.0*   < > 4.3 4.6 4.4  CL 110   < > 106 107 112*  CO2 18*   < > 22 24 22   GLUCOSE 225*   < > 101* 94 97  BUN 43*   < > 39* 38* 37*  CREATININE 1.52*   < > 1.71* 1.68* 1.70*  CALCIUM  8.3*   < > 8.7* 8.6* 8.4*  GFRNONAA 43*   < > 37* 38* 37*  PROT 5.8*   < > 6.5 6.0* 6.1*  ALBUMIN 3.6   < > 4.0 3.8 3.7  AST 79*   < > 29 26 25   ALT 44   < > 16 14 13   ALKPHOS 19*   < > 80 79 77  BILITOT 0.5   < > 0.7 1.0 0.9  BILIDIR 0.3*  --   --   --   --   IBILI 0.2*  --   --   --   --    < > = values in this interval not displayed.    RADIOGRAPHIC STUDIES: I have personally reviewed the radiological images as listed and agreed with the findings in the report. No results found.   Symptomatic anemia # REVISED IPSS-2- LOW RISK- Anemia/thrombocytopenia [at least since 2021]-MULTIFACTORIAL- low grade MDS + CKD- hemoglobin 9-10 MCV 107; platelets 120-slowly trending down since 2021. FEB 2025- BONE MARROW, ASPIRATE, CLOT, CORE:  Overall normocellular bone marrow with otherwise orderly trilineage hematopoiesis.  Cytogenetics- NEG. Intelli Myeloid panel-TET2 c.4625dupA ([id number]) is located in exon 11.   # Worsening baseline anemia/ RXI:rnwupwlz gentle iron [iron biglycinate; 28 mg ] 1 pill a day-APRIL 2025 Iron sat-27%; recommend Retacrit /Venofer. Proceed with Retacrit .   #  CKD stage IV/intermittent hyperkalemia- ? Dehydration/ARB -[  Dr.Singh]- stable.  # Hypertension-poorly controlled-160s systolic. recommend compliance with medication/also checking blood pressures at home frequently.keep a log of blood pressures.stable.  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 stable.    iron studies; ferritin-  # DISPOSITION:  # repeat BP # NO venofer;  ok with retacrit  #  2 weeks-labs- H&H-possible retacrit  # 4 weeks-labs- H&H-possible retacrit  # 6 weeks-labs- H&H-possible retacrit  # follow up in 8  weeks- MD; labs CBC CMP LDH;   possible Retacrit  or venofer-  Dr.B    All questions were answered. The patient knows to call the clinic with any problems, questions or concerns.    Cindy JONELLE Joe, MD 01/21/2024 10:02 AM

## 2024-01-21 NOTE — Progress Notes (Signed)
 No venofer today per MD

## 2024-02-04 ENCOUNTER — Inpatient Hospital Stay: Attending: Internal Medicine

## 2024-02-04 ENCOUNTER — Inpatient Hospital Stay

## 2024-02-04 VITALS — BP 148/65

## 2024-02-04 DIAGNOSIS — N184 Chronic kidney disease, stage 4 (severe): Secondary | ICD-10-CM | POA: Insufficient documentation

## 2024-02-04 DIAGNOSIS — D649 Anemia, unspecified: Secondary | ICD-10-CM

## 2024-02-04 DIAGNOSIS — D631 Anemia in chronic kidney disease: Secondary | ICD-10-CM | POA: Diagnosis present

## 2024-02-04 LAB — HEMOGLOBIN AND HEMATOCRIT (CANCER CENTER ONLY)
HCT: 31.6 % — ABNORMAL LOW (ref 39.0–52.0)
Hemoglobin: 10.2 g/dL — ABNORMAL LOW (ref 13.0–17.0)

## 2024-02-04 MED ORDER — EPOETIN ALFA 20000 UNIT/ML IJ SOLN
20000.0000 [IU] | Freq: Once | INTRAMUSCULAR | Status: AC
  Administered 2024-02-04 (×2): 20000 [IU] via SUBCUTANEOUS
  Filled 2024-02-04: qty 1

## 2024-02-18 ENCOUNTER — Inpatient Hospital Stay

## 2024-02-18 DIAGNOSIS — N184 Chronic kidney disease, stage 4 (severe): Secondary | ICD-10-CM | POA: Diagnosis not present

## 2024-02-18 DIAGNOSIS — D649 Anemia, unspecified: Secondary | ICD-10-CM

## 2024-02-18 LAB — CBC WITH DIFFERENTIAL (CANCER CENTER ONLY)
Abs Immature Granulocytes: 0.01 K/uL (ref 0.00–0.07)
Basophils Absolute: 0.1 K/uL (ref 0.0–0.1)
Basophils Relative: 2 %
Eosinophils Absolute: 0 K/uL (ref 0.0–0.5)
Eosinophils Relative: 0 %
HCT: 34.4 % — ABNORMAL LOW (ref 39.0–52.0)
Hemoglobin: 11 g/dL — ABNORMAL LOW (ref 13.0–17.0)
Immature Granulocytes: 0 %
Lymphocytes Relative: 24 %
Lymphs Abs: 1.2 K/uL (ref 0.7–4.0)
MCH: 33.8 pg (ref 26.0–34.0)
MCHC: 32 g/dL (ref 30.0–36.0)
MCV: 105.8 fL — ABNORMAL HIGH (ref 80.0–100.0)
Monocytes Absolute: 0.5 K/uL (ref 0.1–1.0)
Monocytes Relative: 11 %
Neutro Abs: 3 K/uL (ref 1.7–7.7)
Neutrophils Relative %: 63 %
Platelet Count: 127 K/uL — ABNORMAL LOW (ref 150–400)
RBC: 3.25 MIL/uL — ABNORMAL LOW (ref 4.22–5.81)
RDW: 14 % (ref 11.5–15.5)
WBC Count: 4.8 K/uL (ref 4.0–10.5)
nRBC: 0 % (ref 0.0–0.2)

## 2024-02-18 NOTE — Progress Notes (Signed)
 Parameter for epogen  is to Hold Retacrit   if hemoglobin is greater than or equal to 11   Hgb 11.0 today, no epo inj given.

## 2024-03-03 ENCOUNTER — Inpatient Hospital Stay: Attending: Internal Medicine

## 2024-03-03 ENCOUNTER — Inpatient Hospital Stay

## 2024-03-03 VITALS — BP 159/62

## 2024-03-03 DIAGNOSIS — E860 Dehydration: Secondary | ICD-10-CM | POA: Diagnosis not present

## 2024-03-03 DIAGNOSIS — R5383 Other fatigue: Secondary | ICD-10-CM | POA: Diagnosis not present

## 2024-03-03 DIAGNOSIS — I509 Heart failure, unspecified: Secondary | ICD-10-CM | POA: Insufficient documentation

## 2024-03-03 DIAGNOSIS — E875 Hyperkalemia: Secondary | ICD-10-CM | POA: Diagnosis not present

## 2024-03-03 DIAGNOSIS — N184 Chronic kidney disease, stage 4 (severe): Secondary | ICD-10-CM | POA: Insufficient documentation

## 2024-03-03 DIAGNOSIS — K529 Noninfective gastroenteritis and colitis, unspecified: Secondary | ICD-10-CM | POA: Insufficient documentation

## 2024-03-03 DIAGNOSIS — D696 Thrombocytopenia, unspecified: Secondary | ICD-10-CM | POA: Insufficient documentation

## 2024-03-03 DIAGNOSIS — D469 Myelodysplastic syndrome, unspecified: Secondary | ICD-10-CM | POA: Insufficient documentation

## 2024-03-03 DIAGNOSIS — I129 Hypertensive chronic kidney disease with stage 1 through stage 4 chronic kidney disease, or unspecified chronic kidney disease: Secondary | ICD-10-CM | POA: Insufficient documentation

## 2024-03-03 DIAGNOSIS — D649 Anemia, unspecified: Secondary | ICD-10-CM

## 2024-03-03 DIAGNOSIS — D631 Anemia in chronic kidney disease: Secondary | ICD-10-CM | POA: Insufficient documentation

## 2024-03-03 LAB — HEMOGLOBIN AND HEMATOCRIT (CANCER CENTER ONLY)
HCT: 31.5 % — ABNORMAL LOW (ref 39.0–52.0)
Hemoglobin: 10.1 g/dL — ABNORMAL LOW (ref 13.0–17.0)

## 2024-03-03 MED ORDER — EPOETIN ALFA 20000 UNIT/ML IJ SOLN
20000.0000 [IU] | Freq: Once | INTRAMUSCULAR | Status: AC
  Administered 2024-03-03: 20000 [IU] via SUBCUTANEOUS
  Filled 2024-03-03: qty 1

## 2024-03-16 ENCOUNTER — Other Ambulatory Visit: Payer: Self-pay | Admitting: *Deleted

## 2024-03-16 DIAGNOSIS — D696 Thrombocytopenia, unspecified: Secondary | ICD-10-CM

## 2024-03-16 DIAGNOSIS — D649 Anemia, unspecified: Secondary | ICD-10-CM

## 2024-03-17 ENCOUNTER — Inpatient Hospital Stay

## 2024-03-17 ENCOUNTER — Encounter
Admission: RE | Admit: 2024-03-17 | Discharge: 2024-03-17 | Disposition: A | Discharge status: Home / Self Care | Source: Home / Self Care | Attending: Internal Medicine | Admitting: Internal Medicine

## 2024-03-17 ENCOUNTER — Other Ambulatory Visit: Payer: Self-pay

## 2024-03-17 ENCOUNTER — Inpatient Hospital Stay (HOSPITAL_BASED_OUTPATIENT_CLINIC_OR_DEPARTMENT_OTHER): Admitting: Internal Medicine

## 2024-03-17 ENCOUNTER — Encounter: Payer: Self-pay | Admitting: Internal Medicine

## 2024-03-17 ENCOUNTER — Ambulatory Visit: Payer: Self-pay | Admitting: Internal Medicine

## 2024-03-17 ENCOUNTER — Ambulatory Visit
Admission: RE | Admit: 2024-03-17 | Discharge: 2024-03-17 | Disposition: A | Discharge status: Home / Self Care | Source: Ambulatory Visit | Attending: Internal Medicine | Admitting: Internal Medicine

## 2024-03-17 VITALS — BP 155/65 | HR 61 | Temp 98.0°F | Ht 60.0 in | Wt 120.6 lb

## 2024-03-17 DIAGNOSIS — D469 Myelodysplastic syndrome, unspecified: Secondary | ICD-10-CM

## 2024-03-17 DIAGNOSIS — N184 Chronic kidney disease, stage 4 (severe): Secondary | ICD-10-CM

## 2024-03-17 DIAGNOSIS — I129 Hypertensive chronic kidney disease with stage 1 through stage 4 chronic kidney disease, or unspecified chronic kidney disease: Secondary | ICD-10-CM | POA: Diagnosis not present

## 2024-03-17 DIAGNOSIS — J441 Chronic obstructive pulmonary disease with (acute) exacerbation: Secondary | ICD-10-CM

## 2024-03-17 DIAGNOSIS — R0602 Shortness of breath: Secondary | ICD-10-CM

## 2024-03-17 DIAGNOSIS — E875 Hyperkalemia: Secondary | ICD-10-CM

## 2024-03-17 DIAGNOSIS — T733XXS Exhaustion due to excessive exertion, sequela: Secondary | ICD-10-CM

## 2024-03-17 DIAGNOSIS — E871 Hypo-osmolality and hyponatremia: Secondary | ICD-10-CM

## 2024-03-17 DIAGNOSIS — I509 Heart failure, unspecified: Secondary | ICD-10-CM

## 2024-03-17 DIAGNOSIS — D631 Anemia in chronic kidney disease: Secondary | ICD-10-CM

## 2024-03-17 DIAGNOSIS — D696 Thrombocytopenia, unspecified: Secondary | ICD-10-CM

## 2024-03-17 DIAGNOSIS — D649 Anemia, unspecified: Secondary | ICD-10-CM

## 2024-03-17 DIAGNOSIS — R5383 Other fatigue: Secondary | ICD-10-CM

## 2024-03-17 DIAGNOSIS — R0600 Dyspnea, unspecified: Secondary | ICD-10-CM

## 2024-03-17 DIAGNOSIS — E291 Testicular hypofunction: Secondary | ICD-10-CM

## 2024-03-17 DIAGNOSIS — K529 Noninfective gastroenteritis and colitis, unspecified: Secondary | ICD-10-CM

## 2024-03-17 DIAGNOSIS — N1831 Chronic kidney disease, stage 3a: Secondary | ICD-10-CM

## 2024-03-17 DIAGNOSIS — I10 Essential (primary) hypertension: Secondary | ICD-10-CM

## 2024-03-17 LAB — CBC WITH DIFFERENTIAL (CANCER CENTER ONLY)
Abs Immature Granulocytes: 0.01 K/uL (ref 0.00–0.07)
Basophils Absolute: 0.1 K/uL (ref 0.0–0.1)
Basophils Relative: 1 %
Eosinophils Absolute: 0 K/uL (ref 0.0–0.5)
Eosinophils Relative: 0 %
HCT: 31.5 % — ABNORMAL LOW (ref 39.0–52.0)
Hemoglobin: 10.3 g/dL — ABNORMAL LOW (ref 13.0–17.0)
Immature Granulocytes: 0 %
Lymphocytes Relative: 30 %
Lymphs Abs: 1.4 K/uL (ref 0.7–4.0)
MCH: 34.2 pg — ABNORMAL HIGH (ref 26.0–34.0)
MCHC: 32.7 g/dL (ref 30.0–36.0)
MCV: 104.7 fL — ABNORMAL HIGH (ref 80.0–100.0)
Monocytes Absolute: 0.4 K/uL (ref 0.1–1.0)
Monocytes Relative: 9 %
Neutro Abs: 2.8 K/uL (ref 1.7–7.7)
Neutrophils Relative %: 60 %
Platelet Count: 131 K/uL — ABNORMAL LOW (ref 150–400)
RBC: 3.01 MIL/uL — ABNORMAL LOW (ref 4.22–5.81)
RDW: 14 % (ref 11.5–15.5)
WBC Count: 4.7 K/uL (ref 4.0–10.5)
nRBC: 0 % (ref 0.0–0.2)

## 2024-03-17 LAB — CMP (CANCER CENTER ONLY)
ALT: 15 U/L (ref 0–44)
AST: 29 U/L (ref 15–41)
Albumin: 3.9 g/dL (ref 3.5–5.0)
Alkaline Phosphatase: 76 U/L (ref 38–126)
Anion gap: 7 (ref 5–15)
BUN: 42 mg/dL — ABNORMAL HIGH (ref 8–23)
CO2: 23 mmol/L (ref 22–32)
Calcium: 8.9 mg/dL (ref 8.9–10.3)
Chloride: 106 mmol/L (ref 98–111)
Creatinine: 1.77 mg/dL — ABNORMAL HIGH (ref 0.61–1.24)
GFR, Estimated: 35 mL/min — ABNORMAL LOW (ref >60)
Glucose, Bld: 98 mg/dL (ref 70–99)
Potassium: 4.6 mmol/L (ref 3.5–5.1)
Sodium: 136 mmol/L (ref 135–145)
Total Bilirubin: 0.9 mg/dL (ref 0.0–1.2)
Total Protein: 6.3 g/dL — ABNORMAL LOW (ref 6.5–8.1)

## 2024-03-17 LAB — LACTATE DEHYDROGENASE: LDH: 182 U/L (ref 98–192)

## 2024-03-17 LAB — BRAIN NATRIURETIC PEPTIDE: B Natriuretic Peptide: 49.9 pg/mL (ref 0.0–100.0)

## 2024-03-17 MED ORDER — EPOETIN ALFA 20000 UNIT/ML IJ SOLN
20000.0000 [IU] | Freq: Once | INTRAMUSCULAR | Status: AC
  Administered 2024-03-17: 20000 [IU] via SUBCUTANEOUS
  Filled 2024-03-17: qty 1

## 2024-03-17 NOTE — Progress Notes (Signed)
 [facility] CONSULT NOTE  Patient Care Team: Alla Amis, MD as PCP - General (Family Medicine) Rennie Cindy SAUNDERS, MD as Consulting Physician (Oncology)  CHIEF COMPLAINTS/PURPOSE OF CONSULTATION: Thrombocytopenia/anemia- MDS-anemia- sec to CKD  HISTORY OF PRESENTING ILLNESS: Patient ambulating-independently.  Accompanied by wife.   Colin Decker 89 y.o.  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   Patient continues to complains of ongoing fatigue- excessive sleepiness. However,  Patient denies any worsening fatigue.   No in stool; or black colored stools.  No spontaneous bleeding.   Review of Systems  Constitutional:  Positive for malaise/fatigue. Negative for chills, diaphoresis, fever and weight loss.  HENT:  Negative for nosebleeds and sore throat.   Eyes:  Negative for double vision.  Respiratory:  Negative for cough, hemoptysis, sputum production, shortness of breath and wheezing.   Cardiovascular:  Negative for chest pain, palpitations, orthopnea and leg swelling.  Gastrointestinal:  Negative for abdominal pain, blood in stool, constipation, diarrhea, heartburn, melena, nausea and vomiting.  Genitourinary:  Negative for dysuria, frequency and urgency.  Musculoskeletal:  Negative for back pain and joint pain.  Skin: Negative.  Negative for itching and rash.  Neurological:  Negative for dizziness, tingling, focal weakness, weakness and headaches.  Endo/Heme/Allergies:  Bruises/bleeds easily.  Psychiatric/Behavioral:  Negative for depression. The patient is not nervous/anxious and does not have insomnia.      MEDICAL HISTORY:  Past Medical History:  Diagnosis Date   Asthma    Colitis due to Clostridium difficile 2012   CTS (carpal tunnel syndrome)    Diverticulitis    GERD (gastroesophageal reflux disease)    HOH (hard of hearing)     Hyperlipidemia    Hypertension    Seasonal allergies     SURGICAL HISTORY: Past Surgical History:  Procedure Laterality Date   CARPAL TUNNEL RELEASE Right    CHOLECYSTECTOMY     COLONOSCOPY WITH PROPOFOL  N/A 01/03/2020   Procedure: COLONOSCOPY WITH PROPOFOL ;  Surgeon: Maryruth Ole DASEN, MD;  Location: [facility] ENDOSCOPY;  Service: Endoscopy;  Laterality: N/A;   IR BONE MARROW BIOPSY & ASPIRATION  08/04/2023   NOSE SURGERY     FOR DEVIATED SEPTUM   SHOULDER SURGERY Left     SOCIAL HISTORY: Social History   Socioeconomic History   Marital status: Married    Spouse name: Levorn   Number of children: 2   Years of education: Not on file   Highest education level: Not on file  Occupational History   Not on file  Tobacco Use   Smoking status: Former   Smokeless tobacco: Never  Vaping Use   Vaping status: Never Used  Substance and Sexual Activity   Alcohol use: Yes    Comment: RARE   Drug use: Never   Sexual activity: Not on file  Other Topics Concern   Not on file  Social History Narrative   Lives with wife Levorn in a home. 2 sons    Social Drivers of Corporate investment banker Strain: Low Risk  (04/01/2023)   Received from [facility]   Overall Financial Resource Strain (CARDIA)    Difficulty of Paying Living Expenses: Not hard at all  Food Insecurity: No Food Insecurity (04/01/2023)   Received from [facility]   Hunger Vital Sign    Within the past 12 months, the food you bought just didn't  last and you didn't have money to get more.: Never true    Within the past 12 months, you worried that your food would run out before you got the money to buy more.: Never true  Transportation Needs: No Transportation Needs (04/01/2023)   Received from [facility]   PRAPARE - Transportation    Lack of Transportation (Non-Medical): No    In the past 12 months, has lack of transportation kept you from medical appointments or from  getting medications?: No  Physical Activity: Not on file  Stress: Not on file  Social Connections: Not on file  Intimate Partner Violence: Not At Risk (02/26/2023)   Humiliation, Afraid, Rape, and Kick questionnaire    Fear of Current or Ex-Partner: No    Emotionally Abused: No    Physically Abused: No    Sexually Abused: No    FAMILY HISTORY: History reviewed. No pertinent family history.  ALLERGIES:  is allergic to ciprofloxacin, clarithromycin, codeine, codeine sulfate, naprosyn [naproxen], amoxicillin-pot clavulanate, and erythromycin .  MEDICATIONS:  Current Outpatient Medications  Medication Sig Dispense Refill   allopurinol  (ZYLOPRIM ) 100 MG tablet Take 200 mg by mouth daily.      amLODipine  (NORVASC ) 5 MG tablet Take 1 tablet (5 mg total) by mouth daily. 30 tablet 0   Ferrous Gluconate (IRON 27 PO) Take 27 mg by mouth daily.     furosemide (LASIX) 20 MG tablet Take 20 mg by mouth. Takes 1 daily three times a week     lisinopril (ZESTRIL) 2.5 MG tablet Take 2.5 mg by mouth every other day.     mesalamine  (APRISO ) 0.375 g 24 hr capsule Take 1,500 mg by mouth daily.     Multiple Vitamin (MULTI-VITAMIN) tablet Take 1 tablet by mouth.      No current facility-administered medications for this visit.    PHYSICAL EXAMINATION:  Vitals:   03/17/24 0927 03/17/24 0937  BP: (!) 156/61 (!) 155/65  Pulse: 61   Temp: 98 F (36.7 C)   SpO2: 100%      Filed Weights   03/17/24 0927  Weight: 120 lb 9.6 oz (54.7 kg)   Bilateral crackles noted.  No leg swelling.  No JVD.   Physical Exam Vitals and nursing note reviewed.  HENT:     Head: Normocephalic and atraumatic.     Mouth/Throat:     Pharynx: Oropharynx is clear.  Eyes:     Extraocular Movements: Extraocular movements intact.     Pupils: Pupils are equal, round, and reactive to light.  Cardiovascular:     Rate and Rhythm: Normal rate and regular rhythm.  Pulmonary:     Comments: Decreased breath sounds bilaterally.   Abdominal:     Palpations: Abdomen is soft.  Musculoskeletal:        General: Normal range of motion.     Cervical back: Normal range of motion.  Skin:    General: Skin is warm.  Neurological:     General: No focal deficit present.     Mental Status: He is alert and oriented to person, place, and time.  Psychiatric:        Behavior: Behavior normal.        Judgment: Judgment normal.      LABORATORY DATA:  I have reviewed the data as listed Lab Results  Component Value Date   WBC 4.7 03/17/2024   HGB 10.3 (L) 03/17/2024   HCT 31.5 (L) 03/17/2024   MCV 104.7 (H) 03/17/2024   PLT  131 (L) 03/17/2024   Recent Labs    11/26/23 0853 01/21/24 0906 03/17/24 0913  NA 138 139 136  K 4.6 4.4 4.6  CL 107 112* 106  CO2 24 22 23   GLUCOSE 94 97 98  BUN 38* 37* 42*  CREATININE 1.68* 1.70* 1.77*  CALCIUM  8.6* 8.4* 8.9  GFRNONAA 38* 37* 35*  PROT 6.0* 6.1* 6.3*  ALBUMIN 3.8 3.7 3.9  AST 26 25 29   ALT 14 13 15   ALKPHOS 79 77 76  BILITOT 1.0 0.9 0.9    RADIOGRAPHIC STUDIES: I have personally reviewed the radiological images as listed and agreed with the findings in the report. No results found.   Symptomatic anemia # REVISED IPSS-2- LOW RISK- Anemia/thrombocytopenia [at least since 2021]-MULTIFACTORIAL- low grade MDS + CKD- hemoglobin 9-10 MCV 107; platelets 120-slowly trending down since 2021. FEB 2025- BONE MARROW, ASPIRATE, CLOT, CORE:  Overall normocellular bone marrow with otherwise orderly trilineage hematopoiesis.  Cytogenetics- NEG. Intelli Myeloid panel-TET2 c.4625dupA ([id number]) is located in exon 11.   # Worsening baseline anemia/ RXI:rnwupwlz gentle iron [iron biglycinate; 28 mg ] 1 pill a day-APRIL 2025 Iron sat-27%; recommend Retacrit /Venofer. Proceed with Retacrit .   # Fatigue- ? Etiology- unclear etiology- recommend CXR [given bil crackles]; check B NP-and also check testosterone thyroid  profile at next visit.  Also discussed regarding follow-up with PCP  regarding cardiac/pulmonary evaluation given his fatigue.  # CKD stage IV/intermittent hyperkalemia- ? Dehydration/ARB -[  Dr.Singh]- stable.  # Hypertension-.stable.   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 stable.    iron studies; ferritin-  # DISPOSITION:  # CXR ordered # add BNP today # NO venofer;  ok with retacrit  #  2 weeks-labs- H&H-possible retacrit - Order testosterone; and thyroid  profile labs # 4 weeks-labs- H&H-possible retacrit  # 6 weeks-labs- H&H-possible retacrit  # 8 weeks-labs- H&H-possible retacrit  # 10 weeks-labs- H&H-possible retacrit  # follow up in 12   weeks- MD; labs CBC CMP LDH;   possible Retacrit  or venofer-  Dr.B     All questions were answered. The patient knows to call the clinic with any problems, questions or concerns.    Cindy JONELLE Joe, MD 03/17/2024 10:52 AM

## 2024-03-17 NOTE — Progress Notes (Signed)
 Fatigue/weakness: YES Dyspena: NO  Light headedness: NO Blood in stool: NO

## 2024-03-17 NOTE — Assessment & Plan Note (Addendum)
#   REVISED IPSS-2- LOW RISK- Anemia/thrombocytopenia [at least since 2021]-MULTIFACTORIAL- low grade MDS + CKD- hemoglobin 9-10 MCV 107; platelets 120-slowly trending down since 2021. FEB 2025- BONE MARROW, ASPIRATE, CLOT, CORE:  Overall normocellular bone marrow with otherwise orderly trilineage hematopoiesis.  Cytogenetics- NEG. Intelli Myeloid panel-TET2 c.4625dupA ([id number]) is located in exon 11.   # Worsening baseline anemia/ RXI:rnwupwlz gentle iron [iron biglycinate; 28 mg ] 1 pill a day-APRIL 2025 Iron sat-27%; recommend Retacrit /Venofer. Proceed with Retacrit .   # Fatigue- ? Etiology- unclear etiology- recommend CXR [given bil crackles]; check B NP-and also check testosterone thyroid  profile at next visit.  Also discussed regarding follow-up with PCP regarding cardiac/pulmonary evaluation given his fatigue.  # CKD stage IV/intermittent hyperkalemia- ? Dehydration/ARB -[  Dr.Singh]- stable.  # Hypertension-.stable.   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 stable.    iron studies; ferritin-  # DISPOSITION:  # CXR ordered # add BNP today # NO venofer;  ok with retacrit  #  2 weeks-labs- H&H-possible retacrit - Order testosterone; and thyroid  profile labs # 4 weeks-labs- H&H-possible retacrit  # 6 weeks-labs- H&H-possible retacrit  # 8 weeks-labs- H&H-possible retacrit  # 10 weeks-labs- H&H-possible retacrit  # follow up in 12   weeks- MD; labs CBC CMP LDH;   possible Retacrit  or venofer-  Dr.B

## 2024-03-18 NOTE — Progress Notes (Signed)
 Wife notified.

## 2024-03-31 ENCOUNTER — Inpatient Hospital Stay

## 2024-03-31 ENCOUNTER — Inpatient Hospital Stay: Attending: Internal Medicine

## 2024-03-31 VITALS — BP 149/67 | HR 61

## 2024-03-31 DIAGNOSIS — E875 Hyperkalemia: Secondary | ICD-10-CM | POA: Insufficient documentation

## 2024-03-31 DIAGNOSIS — D469 Myelodysplastic syndrome, unspecified: Secondary | ICD-10-CM | POA: Insufficient documentation

## 2024-03-31 DIAGNOSIS — D649 Anemia, unspecified: Secondary | ICD-10-CM

## 2024-03-31 DIAGNOSIS — R5383 Other fatigue: Secondary | ICD-10-CM | POA: Diagnosis not present

## 2024-03-31 DIAGNOSIS — E291 Testicular hypofunction: Secondary | ICD-10-CM | POA: Insufficient documentation

## 2024-03-31 DIAGNOSIS — I129 Hypertensive chronic kidney disease with stage 1 through stage 4 chronic kidney disease, or unspecified chronic kidney disease: Secondary | ICD-10-CM | POA: Diagnosis not present

## 2024-03-31 DIAGNOSIS — J441 Chronic obstructive pulmonary disease with (acute) exacerbation: Secondary | ICD-10-CM

## 2024-03-31 DIAGNOSIS — E871 Hypo-osmolality and hyponatremia: Secondary | ICD-10-CM

## 2024-03-31 DIAGNOSIS — I10 Essential (primary) hypertension: Secondary | ICD-10-CM

## 2024-03-31 DIAGNOSIS — D696 Thrombocytopenia, unspecified: Secondary | ICD-10-CM | POA: Insufficient documentation

## 2024-03-31 DIAGNOSIS — R0600 Dyspnea, unspecified: Secondary | ICD-10-CM

## 2024-03-31 DIAGNOSIS — I509 Heart failure, unspecified: Secondary | ICD-10-CM | POA: Diagnosis not present

## 2024-03-31 DIAGNOSIS — N1831 Chronic kidney disease, stage 3a: Secondary | ICD-10-CM

## 2024-03-31 DIAGNOSIS — D631 Anemia in chronic kidney disease: Secondary | ICD-10-CM | POA: Insufficient documentation

## 2024-03-31 DIAGNOSIS — K529 Noninfective gastroenteritis and colitis, unspecified: Secondary | ICD-10-CM | POA: Diagnosis not present

## 2024-03-31 DIAGNOSIS — N184 Chronic kidney disease, stage 4 (severe): Secondary | ICD-10-CM | POA: Diagnosis present

## 2024-03-31 DIAGNOSIS — T733XXS Exhaustion due to excessive exertion, sequela: Secondary | ICD-10-CM

## 2024-03-31 DIAGNOSIS — R0602 Shortness of breath: Secondary | ICD-10-CM

## 2024-03-31 LAB — CBC WITH DIFFERENTIAL (CANCER CENTER ONLY)
Abs Immature Granulocytes: 0.02 K/uL (ref 0.00–0.07)
Basophils Absolute: 0.1 K/uL (ref 0.0–0.1)
Basophils Relative: 2 %
Eosinophils Absolute: 0 K/uL (ref 0.0–0.5)
Eosinophils Relative: 0 %
HCT: 32.6 % — ABNORMAL LOW (ref 39.0–52.0)
Hemoglobin: 10.5 g/dL — ABNORMAL LOW (ref 13.0–17.0)
Immature Granulocytes: 0 %
Lymphocytes Relative: 23 %
Lymphs Abs: 1.1 K/uL (ref 0.7–4.0)
MCH: 33.9 pg (ref 26.0–34.0)
MCHC: 32.2 g/dL (ref 30.0–36.0)
MCV: 105.2 fL — ABNORMAL HIGH (ref 80.0–100.0)
Monocytes Absolute: 0.5 K/uL (ref 0.1–1.0)
Monocytes Relative: 11 %
Neutro Abs: 3 K/uL (ref 1.7–7.7)
Neutrophils Relative %: 64 %
Platelet Count: 129 K/uL — ABNORMAL LOW (ref 150–400)
RBC: 3.1 MIL/uL — ABNORMAL LOW (ref 4.22–5.81)
RDW: 14.2 % (ref 11.5–15.5)
WBC Count: 4.7 K/uL (ref 4.0–10.5)
nRBC: 0 % (ref 0.0–0.2)

## 2024-03-31 MED ORDER — EPOETIN ALFA 20000 UNIT/ML IJ SOLN
20000.0000 [IU] | Freq: Once | INTRAMUSCULAR | Status: AC
  Administered 2024-03-31: 20000 [IU] via SUBCUTANEOUS
  Filled 2024-03-31: qty 1

## 2024-04-01 LAB — THYROID PANEL WITH TSH
Free Thyroxine Index: 1.6 (ref 1.2–4.9)
T3 Uptake Ratio: 28 % (ref 24–39)
T4, Total: 5.6 ug/dL (ref 4.5–12.0)
TSH: 3.35 u[IU]/mL (ref 0.450–4.500)

## 2024-04-01 LAB — TESTOSTERONE: Testosterone: 148 ng/dL — ABNORMAL LOW (ref 264–916)

## 2024-04-01 NOTE — Progress Notes (Signed)
 Routed lab through Epic.

## 2024-04-14 ENCOUNTER — Inpatient Hospital Stay

## 2024-04-14 VITALS — BP 166/60 | HR 60

## 2024-04-14 DIAGNOSIS — D649 Anemia, unspecified: Secondary | ICD-10-CM

## 2024-04-14 DIAGNOSIS — I129 Hypertensive chronic kidney disease with stage 1 through stage 4 chronic kidney disease, or unspecified chronic kidney disease: Secondary | ICD-10-CM | POA: Diagnosis not present

## 2024-04-14 LAB — CBC WITH DIFFERENTIAL (CANCER CENTER ONLY)
Abs Immature Granulocytes: 0.01 K/uL (ref 0.00–0.07)
Basophils Absolute: 0.1 K/uL (ref 0.0–0.1)
Basophils Relative: 1 %
Eosinophils Absolute: 0 K/uL (ref 0.0–0.5)
Eosinophils Relative: 0 %
HCT: 32 % — ABNORMAL LOW (ref 39.0–52.0)
Hemoglobin: 10.5 g/dL — ABNORMAL LOW (ref 13.0–17.0)
Immature Granulocytes: 0 %
Lymphocytes Relative: 23 %
Lymphs Abs: 1 K/uL (ref 0.7–4.0)
MCH: 34.3 pg — ABNORMAL HIGH (ref 26.0–34.0)
MCHC: 32.8 g/dL (ref 30.0–36.0)
MCV: 104.6 fL — ABNORMAL HIGH (ref 80.0–100.0)
Monocytes Absolute: 0.6 K/uL (ref 0.1–1.0)
Monocytes Relative: 13 %
Neutro Abs: 2.8 K/uL (ref 1.7–7.7)
Neutrophils Relative %: 63 %
Platelet Count: 114 K/uL — ABNORMAL LOW (ref 150–400)
RBC: 3.06 MIL/uL — ABNORMAL LOW (ref 4.22–5.81)
RDW: 14.1 % (ref 11.5–15.5)
WBC Count: 4.4 K/uL (ref 4.0–10.5)
nRBC: 0 % (ref 0.0–0.2)

## 2024-04-14 MED ORDER — EPOETIN ALFA 20000 UNIT/ML IJ SOLN
20000.0000 [IU] | Freq: Once | INTRAMUSCULAR | Status: AC
  Administered 2024-04-14: 20000 [IU] via SUBCUTANEOUS
  Filled 2024-04-14: qty 1

## 2024-04-28 ENCOUNTER — Inpatient Hospital Stay: Attending: Internal Medicine

## 2024-04-28 ENCOUNTER — Inpatient Hospital Stay

## 2024-04-28 VITALS — BP 155/61

## 2024-04-28 DIAGNOSIS — D469 Myelodysplastic syndrome, unspecified: Secondary | ICD-10-CM | POA: Insufficient documentation

## 2024-04-28 DIAGNOSIS — E875 Hyperkalemia: Secondary | ICD-10-CM | POA: Insufficient documentation

## 2024-04-28 DIAGNOSIS — I129 Hypertensive chronic kidney disease with stage 1 through stage 4 chronic kidney disease, or unspecified chronic kidney disease: Secondary | ICD-10-CM | POA: Insufficient documentation

## 2024-04-28 DIAGNOSIS — E291 Testicular hypofunction: Secondary | ICD-10-CM | POA: Diagnosis not present

## 2024-04-28 DIAGNOSIS — R5383 Other fatigue: Secondary | ICD-10-CM | POA: Diagnosis not present

## 2024-04-28 DIAGNOSIS — K529 Noninfective gastroenteritis and colitis, unspecified: Secondary | ICD-10-CM | POA: Insufficient documentation

## 2024-04-28 DIAGNOSIS — D696 Thrombocytopenia, unspecified: Secondary | ICD-10-CM | POA: Insufficient documentation

## 2024-04-28 DIAGNOSIS — N184 Chronic kidney disease, stage 4 (severe): Secondary | ICD-10-CM | POA: Insufficient documentation

## 2024-04-28 DIAGNOSIS — D649 Anemia, unspecified: Secondary | ICD-10-CM

## 2024-04-28 DIAGNOSIS — I509 Heart failure, unspecified: Secondary | ICD-10-CM | POA: Diagnosis not present

## 2024-04-28 DIAGNOSIS — D631 Anemia in chronic kidney disease: Secondary | ICD-10-CM | POA: Diagnosis present

## 2024-04-28 LAB — HEMOGLOBIN: Hemoglobin: 10.6 g/dL — ABNORMAL LOW (ref 13.0–17.0)

## 2024-04-28 MED ORDER — EPOETIN ALFA 20000 UNIT/ML IJ SOLN
20000.0000 [IU] | Freq: Once | INTRAMUSCULAR | Status: AC
  Administered 2024-04-28: 20000 [IU] via SUBCUTANEOUS
  Filled 2024-04-28: qty 1

## 2024-05-12 ENCOUNTER — Inpatient Hospital Stay

## 2024-05-12 DIAGNOSIS — E871 Hypo-osmolality and hyponatremia: Secondary | ICD-10-CM

## 2024-05-12 DIAGNOSIS — J441 Chronic obstructive pulmonary disease with (acute) exacerbation: Secondary | ICD-10-CM

## 2024-05-12 DIAGNOSIS — I10 Essential (primary) hypertension: Secondary | ICD-10-CM

## 2024-05-12 DIAGNOSIS — R0689 Other abnormalities of breathing: Secondary | ICD-10-CM

## 2024-05-12 DIAGNOSIS — I129 Hypertensive chronic kidney disease with stage 1 through stage 4 chronic kidney disease, or unspecified chronic kidney disease: Secondary | ICD-10-CM | POA: Diagnosis not present

## 2024-05-12 DIAGNOSIS — D649 Anemia, unspecified: Secondary | ICD-10-CM

## 2024-05-12 DIAGNOSIS — N1831 Chronic kidney disease, stage 3a: Secondary | ICD-10-CM

## 2024-05-12 DIAGNOSIS — R0602 Shortness of breath: Secondary | ICD-10-CM

## 2024-05-12 DIAGNOSIS — T733XXS Exhaustion due to excessive exertion, sequela: Secondary | ICD-10-CM

## 2024-05-12 DIAGNOSIS — D696 Thrombocytopenia, unspecified: Secondary | ICD-10-CM

## 2024-05-12 LAB — HEMOGLOBIN AND HEMATOCRIT (CANCER CENTER ONLY)
HCT: 33.4 % — ABNORMAL LOW (ref 39.0–52.0)
Hemoglobin: 10.6 g/dL — ABNORMAL LOW (ref 13.0–17.0)

## 2024-05-12 NOTE — Progress Notes (Signed)
 Hgb at 10.6; no retacrit  today

## 2024-05-26 ENCOUNTER — Inpatient Hospital Stay

## 2024-05-26 ENCOUNTER — Inpatient Hospital Stay: Attending: Internal Medicine

## 2024-05-26 DIAGNOSIS — I10 Essential (primary) hypertension: Secondary | ICD-10-CM

## 2024-05-26 DIAGNOSIS — N1831 Chronic kidney disease, stage 3a: Secondary | ICD-10-CM

## 2024-05-26 DIAGNOSIS — D696 Thrombocytopenia, unspecified: Secondary | ICD-10-CM | POA: Insufficient documentation

## 2024-05-26 DIAGNOSIS — D469 Myelodysplastic syndrome, unspecified: Secondary | ICD-10-CM | POA: Insufficient documentation

## 2024-05-26 DIAGNOSIS — D631 Anemia in chronic kidney disease: Secondary | ICD-10-CM | POA: Diagnosis present

## 2024-05-26 DIAGNOSIS — D649 Anemia, unspecified: Secondary | ICD-10-CM

## 2024-05-26 DIAGNOSIS — K529 Noninfective gastroenteritis and colitis, unspecified: Secondary | ICD-10-CM | POA: Insufficient documentation

## 2024-05-26 DIAGNOSIS — R5383 Other fatigue: Secondary | ICD-10-CM | POA: Insufficient documentation

## 2024-05-26 DIAGNOSIS — I129 Hypertensive chronic kidney disease with stage 1 through stage 4 chronic kidney disease, or unspecified chronic kidney disease: Secondary | ICD-10-CM | POA: Diagnosis not present

## 2024-05-26 DIAGNOSIS — T733XXS Exhaustion due to excessive exertion, sequela: Secondary | ICD-10-CM

## 2024-05-26 DIAGNOSIS — J441 Chronic obstructive pulmonary disease with (acute) exacerbation: Secondary | ICD-10-CM

## 2024-05-26 DIAGNOSIS — N184 Chronic kidney disease, stage 4 (severe): Secondary | ICD-10-CM | POA: Diagnosis present

## 2024-05-26 DIAGNOSIS — R0600 Dyspnea, unspecified: Secondary | ICD-10-CM

## 2024-05-26 DIAGNOSIS — R0602 Shortness of breath: Secondary | ICD-10-CM

## 2024-05-26 DIAGNOSIS — E871 Hypo-osmolality and hyponatremia: Secondary | ICD-10-CM

## 2024-05-26 DIAGNOSIS — E291 Testicular hypofunction: Secondary | ICD-10-CM | POA: Diagnosis not present

## 2024-05-26 LAB — HEMOGLOBIN AND HEMATOCRIT (CANCER CENTER ONLY)
HCT: 33.3 % — ABNORMAL LOW (ref 39.0–52.0)
Hemoglobin: 10.6 g/dL — ABNORMAL LOW (ref 13.0–17.0)

## 2024-05-26 NOTE — Progress Notes (Signed)
 Patients hgb is at 10.6; Per provider, hold retacrit .

## 2024-06-07 ENCOUNTER — Other Ambulatory Visit

## 2024-06-07 ENCOUNTER — Ambulatory Visit: Admitting: Internal Medicine

## 2024-06-07 ENCOUNTER — Ambulatory Visit

## 2024-06-10 ENCOUNTER — Ambulatory Visit: Admitting: Internal Medicine

## 2024-06-10 ENCOUNTER — Ambulatory Visit

## 2024-06-10 ENCOUNTER — Other Ambulatory Visit

## 2024-06-10 VITALS — BP 170/69 | HR 61 | Temp 97.4°F | Resp 20 | Ht 60.0 in | Wt 120.3 lb

## 2024-06-10 DIAGNOSIS — D649 Anemia, unspecified: Secondary | ICD-10-CM

## 2024-06-10 DIAGNOSIS — D696 Thrombocytopenia, unspecified: Secondary | ICD-10-CM

## 2024-06-10 DIAGNOSIS — T733XXS Exhaustion due to excessive exertion, sequela: Secondary | ICD-10-CM

## 2024-06-10 DIAGNOSIS — J441 Chronic obstructive pulmonary disease with (acute) exacerbation: Secondary | ICD-10-CM

## 2024-06-10 DIAGNOSIS — R0600 Dyspnea, unspecified: Secondary | ICD-10-CM

## 2024-06-10 DIAGNOSIS — N1831 Chronic kidney disease, stage 3a: Secondary | ICD-10-CM

## 2024-06-10 DIAGNOSIS — I129 Hypertensive chronic kidney disease with stage 1 through stage 4 chronic kidney disease, or unspecified chronic kidney disease: Secondary | ICD-10-CM | POA: Diagnosis not present

## 2024-06-10 DIAGNOSIS — E871 Hypo-osmolality and hyponatremia: Secondary | ICD-10-CM

## 2024-06-10 DIAGNOSIS — I10 Essential (primary) hypertension: Secondary | ICD-10-CM

## 2024-06-10 DIAGNOSIS — R0602 Shortness of breath: Secondary | ICD-10-CM

## 2024-06-10 LAB — CBC WITH DIFFERENTIAL (CANCER CENTER ONLY)
Abs Immature Granulocytes: 0.01 K/uL (ref 0.00–0.07)
Basophils Absolute: 0.1 K/uL (ref 0.0–0.1)
Basophils Relative: 1 %
Eosinophils Absolute: 0 K/uL (ref 0.0–0.5)
Eosinophils Relative: 0 %
HCT: 28.7 % — ABNORMAL LOW (ref 39.0–52.0)
Hemoglobin: 9.2 g/dL — ABNORMAL LOW (ref 13.0–17.0)
Immature Granulocytes: 0 %
Lymphocytes Relative: 25 %
Lymphs Abs: 1.1 K/uL (ref 0.7–4.0)
MCH: 34.3 pg — ABNORMAL HIGH (ref 26.0–34.0)
MCHC: 32.1 g/dL (ref 30.0–36.0)
MCV: 107.1 fL — ABNORMAL HIGH (ref 80.0–100.0)
Monocytes Absolute: 0.4 K/uL (ref 0.1–1.0)
Monocytes Relative: 10 %
Neutro Abs: 2.8 K/uL (ref 1.7–7.7)
Neutrophils Relative %: 64 %
Platelet Count: 118 K/uL — ABNORMAL LOW (ref 150–400)
RBC: 2.68 MIL/uL — ABNORMAL LOW (ref 4.22–5.81)
RDW: 14.2 % (ref 11.5–15.5)
WBC Count: 4.3 K/uL (ref 4.0–10.5)
nRBC: 0 % (ref 0.0–0.2)

## 2024-06-10 LAB — CMP (CANCER CENTER ONLY)
ALT: 15 U/L (ref 0–44)
AST: 29 U/L (ref 15–41)
Albumin: 3.9 g/dL (ref 3.5–5.0)
Alkaline Phosphatase: 91 U/L (ref 38–126)
Anion gap: 11 (ref 5–15)
BUN: 37 mg/dL — ABNORMAL HIGH (ref 8–23)
CO2: 23 mmol/L (ref 22–32)
Calcium: 9.1 mg/dL (ref 8.9–10.3)
Chloride: 109 mmol/L (ref 98–111)
Creatinine: 1.78 mg/dL — ABNORMAL HIGH (ref 0.61–1.24)
GFR, Estimated: 35 mL/min — ABNORMAL LOW (ref >60)
Glucose, Bld: 148 mg/dL — ABNORMAL HIGH (ref 70–99)
Potassium: 4.7 mmol/L (ref 3.5–5.1)
Sodium: 143 mmol/L (ref 135–145)
Total Bilirubin: 0.4 mg/dL (ref 0.0–1.2)
Total Protein: 6 g/dL — ABNORMAL LOW (ref 6.5–8.1)

## 2024-06-10 LAB — LACTATE DEHYDROGENASE: LDH: 223 U/L (ref 105–235)

## 2024-06-10 LAB — IRON AND TIBC
Iron: 87 ug/dL (ref 45–182)
Saturation Ratios: 40 % — ABNORMAL HIGH (ref 17.9–39.5)
TIBC: 217 ug/dL — ABNORMAL LOW (ref 250–450)
UIBC: 130 ug/dL

## 2024-06-10 LAB — FERRITIN: Ferritin: 305 ng/mL (ref 24–336)

## 2024-06-10 MED ADMIN — Epoetin Alfa Inj 20000 Unit/ML: 20000 [IU] | SUBCUTANEOUS | @ 11:00:00 | NDC 59676032000

## 2024-06-10 MED FILL — Epoetin Alfa Inj 20000 Unit/ML: 20000.0000 [IU] | INTRAMUSCULAR | Qty: 1 | Status: AC

## 2024-06-10 NOTE — Addendum Note (Signed)
 Addended by: LAEL BROWNING A on: 06/10/2024 03:18 PM   Modules accepted: Orders

## 2024-06-10 NOTE — Progress Notes (Signed)
  [facility]  Patient Care Team: Alla Amis, MD as PCP - General (Family Medicine) Rennie Cindy SAUNDERS, MD as Consulting Physician (Oncology)  CHIEF COMPLAINTS/PURPOSE OF CONSULTATION: Thrombocytopenia/anemia- MDS-anemia- sec to CKD  HISTORY OF PRESENTING ILLNESS: Patient ambulating-independently.  Accompanied by wife.   Lamar BIRCH Homan 89 y.o.  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   Discussed the use of AI scribe software for clinical note transcription with the patient, who gave verbal consent to proceed.  History of Present Illness   LORNE WINKELS is a 89 year old male with myelodysplastic syndrome and anemia who presents for hematology follow-up due to persistent fatigue.  He is currently managed with regular erythropoietin injections for myelodysplastic syndrome. He continues to experience significant fatigue, described by his caregiver as 'very tired.' His most recent hemoglobin is 9.2 g/dL. He has not received recent transfusions and denies new symptoms such as bleeding, ecchymosis, or signs of infection. No other acute complaints were reported.  His blood pressure was elevated at 170/69 during the visit, which he attributes to anxiety related to the clinical setting. He denies headache, chest pain, or dyspnea.  Recent laboratory evaluation revealed a low testosterone  level of 148. No additional symptoms of hypogonadism were discussed.     Review of Systems  Constitutional:  Positive for malaise/fatigue. Negative for chills, diaphoresis, fever and weight loss.  HENT:  Negative for nosebleeds and sore throat.   Eyes:  Negative for double vision.  Respiratory:  Negative for cough, hemoptysis, sputum production, shortness of breath and wheezing.   Cardiovascular:  Negative for chest pain, palpitations, orthopnea and leg  swelling.  Gastrointestinal:  Negative for abdominal pain, blood in stool, constipation, diarrhea, heartburn, melena, nausea and vomiting.  Genitourinary:  Negative for dysuria, frequency and urgency.  Musculoskeletal:  Negative for back pain and joint pain.  Skin: Negative.  Negative for itching and rash.  Neurological:  Negative for dizziness, tingling, focal weakness, weakness and headaches.  Endo/Heme/Allergies:  Bruises/bleeds easily.  Psychiatric/Behavioral:  Negative for depression. The patient is not nervous/anxious and does not have insomnia.      MEDICAL HISTORY:  Past Medical History:  Diagnosis Date   Asthma    Colitis due to Clostridium difficile 2012   CTS (carpal tunnel syndrome)    Diverticulitis    GERD (gastroesophageal reflux disease)    HOH (hard of hearing)    Hyperlipidemia    Hypertension    Seasonal allergies     SURGICAL HISTORY: Past Surgical History:  Procedure Laterality Date   CARPAL TUNNEL RELEASE Right    CHOLECYSTECTOMY     COLONOSCOPY WITH PROPOFOL  N/A 01/03/2020   Procedure: COLONOSCOPY WITH PROPOFOL ;  Surgeon: Maryruth Ole DASEN, MD;  Location: [facility] ENDOSCOPY;  Service: Endoscopy;  Laterality: N/A;   IR BONE MARROW BIOPSY & ASPIRATION  08/04/2023   NOSE SURGERY     FOR DEVIATED SEPTUM   SHOULDER SURGERY Left     SOCIAL HISTORY: Social History   Socioeconomic History   Marital status: Married    Spouse name: Levorn   Number of children: 2   Years of education: Not on file   Highest education level: Not on file  Occupational History   Not on file  Tobacco Use   Smoking status: Former   Smokeless tobacco: Never  Vaping Use   Vaping status: Never Used  Substance and Sexual Activity  Alcohol use: Yes    Comment: RARE   Drug use: Never   Sexual activity: Not on file  Other Topics Concern   Not on file  Social History Narrative   Lives with wife Levorn in a home. 2 sons    Social Drivers of Health   Tobacco Use: Medium Risk  (06/10/2024)   Patient History    Smoking Tobacco Use: Former    Smokeless Tobacco Use: Never    Passive Exposure: Not on file  Financial Resource Strain: Low Risk  (04/01/2023)   Received from [facility]   Overall Financial Resource Strain (CARDIA)    Difficulty of Paying Living Expenses: Not hard at all  Food Insecurity: No Food Insecurity (04/01/2023)   Received from [facility]   Epic    Within the past 12 months, the food you bought just didn't last and you didn't have money to get more.: Never true    Within the past 12 months, you worried that your food would run out before you got the money to buy more.: Never true  Transportation Needs: No Transportation Needs (04/01/2023)   Received from [facility]   PRAPARE - Transportation    Lack of Transportation (Non-Medical): No    In the past 12 months, has lack of transportation kept you from medical appointments or from getting medications?: No  Physical Activity: Not on file  Stress: Not on file  Social Connections: Not on file  Intimate Partner Violence: Not At Risk (02/26/2023)   Humiliation, Afraid, Rape, and Kick questionnaire    Fear of Current or Ex-Partner: No    Emotionally Abused: No    Physically Abused: No    Sexually Abused: No  Depression (PHQ2-9): Low Risk (06/10/2024)   Depression (PHQ2-9)    PHQ-2 Score: 0  Alcohol Screen: Not on file  Housing: Low Risk  (09/09/2023)   Received from [facility]   Epic    In the last 12 months, was there a time when you were not able to pay the mortgage or rent on time?: No    In the past 12 months, how many times have you moved where you were living?: 0    At any time in the past 12 months, were you homeless or living in a shelter (including now)?: No  Utilities: Not At Risk (04/01/2023)   Received from [facility] Utilities    Threatened with loss of utilities: No  Health Literacy:  Not on file    FAMILY HISTORY: History reviewed. No pertinent family history.  ALLERGIES:  is allergic to ciprofloxacin, clarithromycin, codeine, codeine sulfate, naprosyn [naproxen], amoxicillin-pot clavulanate, and erythromycin .  MEDICATIONS:  Current Outpatient Medications  Medication Sig Dispense Refill   allopurinol  (ZYLOPRIM ) 100 MG tablet Take 200 mg by mouth daily.      amLODipine  (NORVASC ) 5 MG tablet Take 1 tablet (5 mg total) by mouth daily. 30 tablet 0   Ferrous Gluconate (IRON 27 PO) Take 27 mg by mouth daily.     furosemide (LASIX) 20 MG tablet Take 20 mg by mouth. Takes 1 daily three times a week     lisinopril (ZESTRIL) 2.5 MG tablet Take 2.5 mg by mouth every other day.     mesalamine  (APRISO ) 0.375 g 24 hr capsule Take 1,500 mg by mouth daily.     Multiple Vitamin (MULTI-VITAMIN) tablet Take 1 tablet by mouth.      No  current facility-administered medications for this visit.    PHYSICAL EXAMINATION:  Vitals:   06/10/24 1019 06/10/24 1103  BP: (!) 183/65 (!) 170/69  Pulse: 61   Resp: 20   Temp: (!) 97.4 F (36.3 C)   SpO2: 98%      Filed Weights   06/10/24 1019  Weight: 120 lb 4.8 oz (54.6 kg)   Bilateral crackles noted.  No leg swelling.  No JVD.   Physical Exam Vitals and nursing note reviewed.  HENT:     Head: Normocephalic and atraumatic.     Mouth/Throat:     Pharynx: Oropharynx is clear.  Eyes:     Extraocular Movements: Extraocular movements intact.     Pupils: Pupils are equal, round, and reactive to light.  Cardiovascular:     Rate and Rhythm: Normal rate and regular rhythm.  Pulmonary:     Comments: Decreased breath sounds bilaterally.  Abdominal:     Palpations: Abdomen is soft.  Musculoskeletal:        General: Normal range of motion.     Cervical back: Normal range of motion.  Skin:    General: Skin is warm.  Neurological:     General: No focal deficit present.     Mental Status: He is alert and oriented to person, place,  and time.  Psychiatric:        Behavior: Behavior normal.        Judgment: Judgment normal.      LABORATORY DATA:  I have reviewed the data as listed Lab Results  Component Value Date   WBC 4.3 06/10/2024   HGB 9.2 (L) 06/10/2024   HCT 28.7 (L) 06/10/2024   MCV 107.1 (H) 06/10/2024   PLT 118 (L) 06/10/2024   Recent Labs    01/21/24 0906 03/17/24 0913 06/10/24 1023  NA 139 136 143  K 4.4 4.6 4.7  CL 112* 106 109  CO2 22 23 23   GLUCOSE 97 98 148*  BUN 37* 42* 37*  CREATININE 1.70* 1.77* 1.78*  CALCIUM  8.4* 8.9 9.1  GFRNONAA 37* 35* 35*  PROT 6.1* 6.3* 6.0*  ALBUMIN 3.7 3.9 3.9  AST 25 29 29   ALT 13 15 15   ALKPHOS 77 76 91  BILITOT 0.9 0.9 0.4    RADIOGRAPHIC STUDIES: I have personally reviewed the radiological images as listed and agreed with the findings in the report. No results found.   Symptomatic anemia # REVISED IPSS-2- LOW RISK- Anemia/thrombocytopenia [at least since 2021]-MULTIFACTORIAL- low grade MDS + CKD- hemoglobin 9-10 MCV 107; platelets 120-slowly trending down since 2021. FEB 2025- BONE MARROW, ASPIRATE, CLOT, CORE:  Overall normocellular bone marrow with otherwise orderly trilineage hematopoiesis.  Cytogenetics- NEG. Intelli Myeloid panel-TET2 c.4625dupA ([id number]) is located in exon 11.   # Worsening baseline anemia/ RXI:rnwupwlz gentle iron [iron biglycinate; 28 mg ] 1 pill a day-APRIL 2025 Iron sat-27%; recommend Retacrit /Venofer. Proceed with Retacrit .   # Fatigue- ? LOW testosterone  [248] - Dr.L appt today-  # CKD stage IV/intermittent hyperkalemia- ? Dehydration/ARB -[  Dr.Singh]- stable.  # Hypertension-.stable.   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 stable.    iron studies; ferritin-  # DISPOSITION:  # iron studies; ferritin-add to labs today # NO venofer;  ok with retacrit - ok with BP #  2 weeks-labs- H&H-possible retacrit -  # 4 weeks-labs- H&H-possible retacrit  # 6 weeks-labs- H&H-possible retacrit  # 8  weeks-labs- H&H-possible retacrit  # 10 weeks-labs- H&H-possible retacrit  # follow up in 12   weeks- MD; labs CBC  CMP LDH;   possible Retacrit  or venofer-  Dr.B     All questions were answered. The patient knows to call the clinic with any problems, questions or concerns.    Cindy JONELLE Joe, MD 06/10/2024 11:18 AM

## 2024-06-10 NOTE — Assessment & Plan Note (Addendum)
#   REVISED IPSS-2- LOW RISK- Anemia/thrombocytopenia [at least since 2021]-MULTIFACTORIAL- low grade MDS + CKD- hemoglobin 9-10 MCV 107; platelets 120-slowly trending down since 2021. FEB 2025- BONE MARROW, ASPIRATE, CLOT, CORE:  Overall normocellular bone marrow with otherwise orderly trilineage hematopoiesis.  Cytogenetics- NEG. Intelli Myeloid panel-TET2 c.4625dupA ([id number]) is located in exon 11.   # Worsening baseline anemia/ RXI:rnwupwlz gentle iron [iron biglycinate; 28 mg ] 1 pill a day-APRIL 2025 Iron sat-27%; recommend Retacrit /Venofer. Proceed with Retacrit .   # Fatigue- ? LOW testosterone  [248] - Dr.L appt today-  # CKD stage IV/intermittent hyperkalemia- ? Dehydration/ARB -[  Dr.Singh]- stable.  # Hypertension-.stable.   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 stable.    iron studies; ferritin-  # DISPOSITION:  # iron studies; ferritin-add to labs today # NO venofer;  ok with retacrit - ok with BP #  2 weeks-labs- H&H-possible retacrit -  # 4 weeks-labs- H&H-possible retacrit  # 6 weeks-labs- H&H-possible retacrit  # 8 weeks-labs- H&H-possible retacrit  # 10 weeks-labs- H&H-possible retacrit  # follow up in 12   weeks- MD; labs CBC CMP LDH;   possible Retacrit  or venofer-  Dr.B

## 2024-06-10 NOTE — Progress Notes (Signed)
 Fatigue/weakness: YES Dyspena: NO  Light headedness: NO Blood in stool: NO

## 2024-06-25 ENCOUNTER — Inpatient Hospital Stay

## 2024-06-25 ENCOUNTER — Telehealth: Payer: Self-pay

## 2024-06-25 ENCOUNTER — Inpatient Hospital Stay: Attending: Internal Medicine

## 2024-06-25 DIAGNOSIS — E291 Testicular hypofunction: Secondary | ICD-10-CM | POA: Diagnosis not present

## 2024-06-25 DIAGNOSIS — N184 Chronic kidney disease, stage 4 (severe): Secondary | ICD-10-CM | POA: Diagnosis present

## 2024-06-25 DIAGNOSIS — D631 Anemia in chronic kidney disease: Secondary | ICD-10-CM | POA: Insufficient documentation

## 2024-06-25 DIAGNOSIS — D696 Thrombocytopenia, unspecified: Secondary | ICD-10-CM | POA: Insufficient documentation

## 2024-06-25 DIAGNOSIS — R5383 Other fatigue: Secondary | ICD-10-CM | POA: Insufficient documentation

## 2024-06-25 DIAGNOSIS — D469 Myelodysplastic syndrome, unspecified: Secondary | ICD-10-CM | POA: Diagnosis not present

## 2024-06-25 DIAGNOSIS — D649 Anemia, unspecified: Secondary | ICD-10-CM

## 2024-06-25 DIAGNOSIS — K529 Noninfective gastroenteritis and colitis, unspecified: Secondary | ICD-10-CM | POA: Insufficient documentation

## 2024-06-25 DIAGNOSIS — I129 Hypertensive chronic kidney disease with stage 1 through stage 4 chronic kidney disease, or unspecified chronic kidney disease: Secondary | ICD-10-CM | POA: Insufficient documentation

## 2024-06-25 LAB — HEMOGLOBIN: Hemoglobin: 10 g/dL — ABNORMAL LOW (ref 13.0–17.0)

## 2024-06-25 NOTE — Telephone Encounter (Signed)
 Hemoglobin 10.0, no injection given.

## 2024-06-25 NOTE — Progress Notes (Signed)
 Hemoglobin 10.0. No injection given

## 2024-07-08 ENCOUNTER — Inpatient Hospital Stay

## 2024-07-08 VITALS — BP 141/69

## 2024-07-08 DIAGNOSIS — D649 Anemia, unspecified: Secondary | ICD-10-CM

## 2024-07-08 DIAGNOSIS — I129 Hypertensive chronic kidney disease with stage 1 through stage 4 chronic kidney disease, or unspecified chronic kidney disease: Secondary | ICD-10-CM | POA: Diagnosis not present

## 2024-07-08 LAB — HEMOGLOBIN AND HEMATOCRIT (CANCER CENTER ONLY)
HCT: 29.9 % — ABNORMAL LOW (ref 39.0–52.0)
Hemoglobin: 9.6 g/dL — ABNORMAL LOW (ref 13.0–17.0)

## 2024-07-08 MED ORDER — EPOETIN ALFA 20000 UNIT/ML IJ SOLN
20000.0000 [IU] | Freq: Once | INTRAMUSCULAR | Status: AC
  Administered 2024-07-08: 20000 [IU] via SUBCUTANEOUS
  Filled 2024-07-08: qty 1

## 2024-07-22 ENCOUNTER — Inpatient Hospital Stay

## 2024-07-22 VITALS — BP 163/64

## 2024-07-22 DIAGNOSIS — I129 Hypertensive chronic kidney disease with stage 1 through stage 4 chronic kidney disease, or unspecified chronic kidney disease: Secondary | ICD-10-CM | POA: Diagnosis not present

## 2024-07-22 DIAGNOSIS — D649 Anemia, unspecified: Secondary | ICD-10-CM

## 2024-07-22 LAB — HEMOGLOBIN AND HEMATOCRIT (CANCER CENTER ONLY)
HCT: 29.8 % — ABNORMAL LOW (ref 39.0–52.0)
Hemoglobin: 9.5 g/dL — ABNORMAL LOW (ref 13.0–17.0)

## 2024-07-22 MED ORDER — EPOETIN ALFA 20000 UNIT/ML IJ SOLN
20000.0000 [IU] | Freq: Once | INTRAMUSCULAR | Status: AC
  Administered 2024-07-22: 20000 [IU] via SUBCUTANEOUS
  Filled 2024-07-22: qty 1

## 2024-08-05 ENCOUNTER — Inpatient Hospital Stay

## 2024-08-19 ENCOUNTER — Inpatient Hospital Stay

## 2024-09-02 ENCOUNTER — Inpatient Hospital Stay: Admitting: Internal Medicine

## 2024-09-02 ENCOUNTER — Inpatient Hospital Stay
